# Patient Record
Sex: Female | Born: 1975 | Race: White | Hispanic: No | Marital: Single | Smoking: Former smoker
Health system: Southern US, Community
[De-identification: ages and names within clinical notes are randomized; demographics above are authoritative.]

## PROBLEM LIST (undated history)

## (undated) DIAGNOSIS — I10 Essential (primary) hypertension: Secondary | ICD-10-CM

---

## 2000-05-26 ENCOUNTER — Encounter: Admission: RE | Admit: 2000-05-26 | Discharge: 2000-05-26 | Payer: Self-pay | Admitting: Emergency Medicine

## 2005-07-16 ENCOUNTER — Emergency Department (HOSPITAL_COMMUNITY): Admission: EM | Admit: 2005-07-16 | Discharge: 2005-07-16 | Payer: Self-pay | Admitting: Emergency Medicine

## 2005-08-08 ENCOUNTER — Inpatient Hospital Stay (HOSPITAL_COMMUNITY): Admission: EM | Admit: 2005-08-08 | Discharge: 2005-08-09 | Payer: Self-pay | Admitting: Emergency Medicine

## 2005-08-09 ENCOUNTER — Ambulatory Visit: Payer: Self-pay | Admitting: Psychiatry

## 2005-08-09 ENCOUNTER — Inpatient Hospital Stay (HOSPITAL_COMMUNITY): Admission: RE | Admit: 2005-08-09 | Discharge: 2005-08-12 | Payer: Self-pay | Admitting: Psychiatry

## 2005-10-10 ENCOUNTER — Emergency Department (HOSPITAL_COMMUNITY): Admission: EM | Admit: 2005-10-10 | Discharge: 2005-10-11 | Payer: Self-pay | Admitting: Emergency Medicine

## 2005-10-14 ENCOUNTER — Emergency Department (HOSPITAL_COMMUNITY): Admission: EM | Admit: 2005-10-14 | Discharge: 2005-10-15 | Payer: Self-pay | Admitting: Emergency Medicine

## 2005-10-14 ENCOUNTER — Emergency Department (HOSPITAL_COMMUNITY): Admission: EM | Admit: 2005-10-14 | Discharge: 2005-10-14 | Payer: Self-pay | Admitting: *Deleted

## 2009-01-22 ENCOUNTER — Other Ambulatory Visit: Admission: RE | Admit: 2009-01-22 | Discharge: 2009-01-22 | Payer: Self-pay | Admitting: Obstetrics and Gynecology

## 2009-10-14 ENCOUNTER — Emergency Department (HOSPITAL_COMMUNITY): Admission: EM | Admit: 2009-10-14 | Discharge: 2009-10-14 | Payer: Self-pay | Admitting: Emergency Medicine

## 2009-12-04 ENCOUNTER — Emergency Department (HOSPITAL_COMMUNITY): Admission: EM | Admit: 2009-12-04 | Discharge: 2009-12-04 | Payer: Self-pay | Admitting: Emergency Medicine

## 2010-01-12 ENCOUNTER — Emergency Department (HOSPITAL_COMMUNITY): Admission: EM | Admit: 2010-01-12 | Discharge: 2010-01-12 | Payer: Self-pay | Admitting: Emergency Medicine

## 2010-06-03 ENCOUNTER — Emergency Department (HOSPITAL_BASED_OUTPATIENT_CLINIC_OR_DEPARTMENT_OTHER)
Admission: EM | Admit: 2010-06-03 | Discharge: 2010-06-03 | Disposition: A | Discharge status: Home / Self Care | Payer: Medicare Other | Attending: Emergency Medicine | Admitting: Emergency Medicine

## 2010-06-03 ENCOUNTER — Emergency Department (INDEPENDENT_AMBULATORY_CARE_PROVIDER_SITE_OTHER): Payer: Medicare Other

## 2010-06-03 ENCOUNTER — Encounter (HOSPITAL_BASED_OUTPATIENT_CLINIC_OR_DEPARTMENT_OTHER): Payer: Self-pay | Admitting: Radiology

## 2010-06-03 DIAGNOSIS — F17200 Nicotine dependence, unspecified, uncomplicated: Secondary | ICD-10-CM | POA: Insufficient documentation

## 2010-06-03 DIAGNOSIS — M545 Low back pain, unspecified: Secondary | ICD-10-CM

## 2010-06-03 DIAGNOSIS — N838 Other noninflammatory disorders of ovary, fallopian tube and broad ligament: Secondary | ICD-10-CM | POA: Insufficient documentation

## 2010-06-03 LAB — URINALYSIS, ROUTINE W REFLEX MICROSCOPIC
Specific Gravity, Urine: 1.013 (ref 1.005–1.030)
Urine Glucose, Fasting: NEGATIVE mg/dL
Urobilinogen, UA: 0.2 mg/dL (ref 0.0–1.0)

## 2010-06-03 LAB — PREGNANCY, URINE: Preg Test, Ur: NEGATIVE

## 2010-06-06 ENCOUNTER — Emergency Department (HOSPITAL_BASED_OUTPATIENT_CLINIC_OR_DEPARTMENT_OTHER)
Admission: EM | Admit: 2010-06-06 | Discharge: 2010-06-06 | Disposition: A | Discharge status: Home / Self Care | Payer: Medicare Other | Attending: Emergency Medicine | Admitting: Emergency Medicine

## 2010-06-06 DIAGNOSIS — M549 Dorsalgia, unspecified: Secondary | ICD-10-CM | POA: Insufficient documentation

## 2010-06-06 DIAGNOSIS — F17200 Nicotine dependence, unspecified, uncomplicated: Secondary | ICD-10-CM | POA: Insufficient documentation

## 2010-07-14 LAB — WET PREP, GENITAL
Clue Cells Wet Prep HPF POC: NONE SEEN
Trich, Wet Prep: NONE SEEN

## 2010-07-14 LAB — URINALYSIS, ROUTINE W REFLEX MICROSCOPIC
Hgb urine dipstick: NEGATIVE
Ketones, ur: NEGATIVE mg/dL
Specific Gravity, Urine: 1.03 (ref 1.005–1.030)
pH: 6 (ref 5.0–8.0)

## 2010-09-01 ENCOUNTER — Other Ambulatory Visit (HOSPITAL_COMMUNITY)
Admission: RE | Admit: 2010-09-01 | Discharge: 2010-09-01 | Disposition: A | Discharge status: Home / Self Care | Payer: Medicare Other | Source: Ambulatory Visit | Attending: Obstetrics and Gynecology | Admitting: Obstetrics and Gynecology

## 2010-09-01 DIAGNOSIS — Z124 Encounter for screening for malignant neoplasm of cervix: Secondary | ICD-10-CM | POA: Insufficient documentation

## 2010-09-01 DIAGNOSIS — Z113 Encounter for screening for infections with a predominantly sexual mode of transmission: Secondary | ICD-10-CM | POA: Insufficient documentation

## 2010-09-01 LAB — ANTIBODY SCREEN: Antibody Screen: NEGATIVE

## 2010-09-01 LAB — RUBELLA ANTIBODY, IGM: Rubella: IMMUNE

## 2010-09-01 LAB — HEPATITIS B SURFACE ANTIGEN: Hepatitis B Surface Ag: NEGATIVE
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
## 2010-10-25 IMAGING — US US PELVIS COMPLETE MODIFY
1 series · 13 of 25 positions shown · non-contrast
Comparison: CT 07/16/2005.

CLINICAL DATA: History of pain.  Last menstrual period 12/30/2009.



[Series 1: us pelvis complete modify · 0.23mm/px · 13 of 73 slices shown]
[im 1/73]
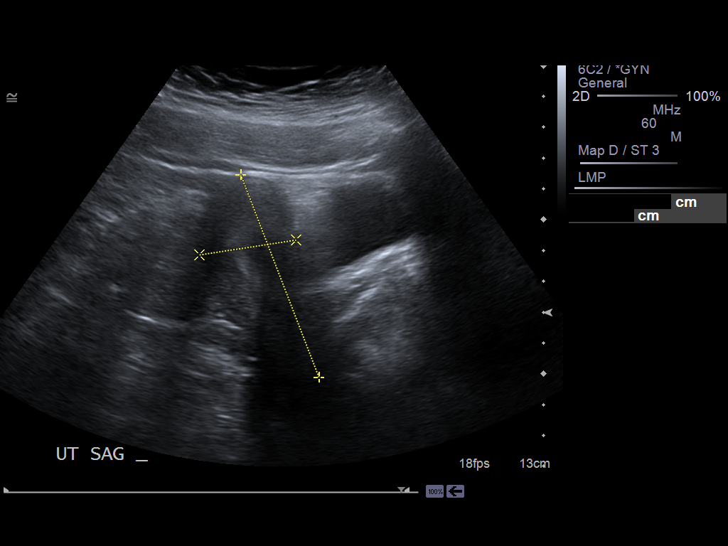
[im 7/73]
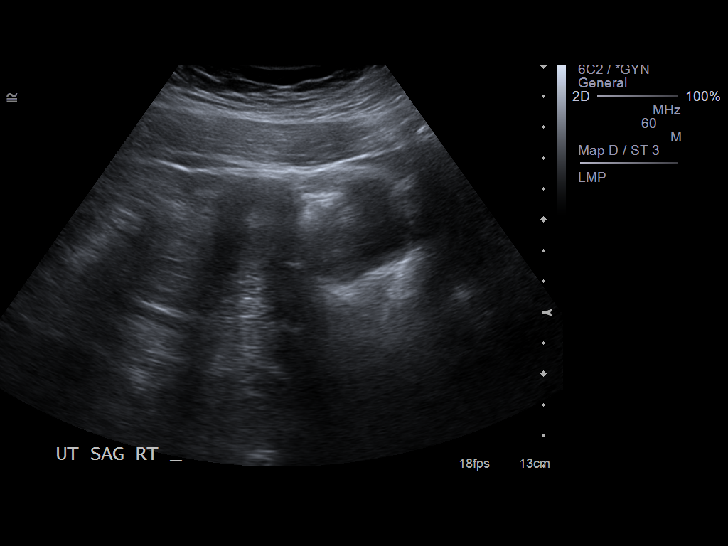
[im 13/73]
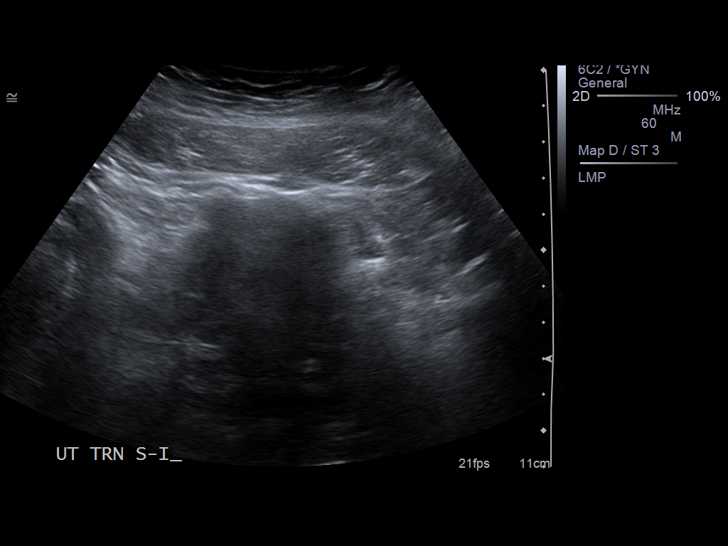
[im 19/73]
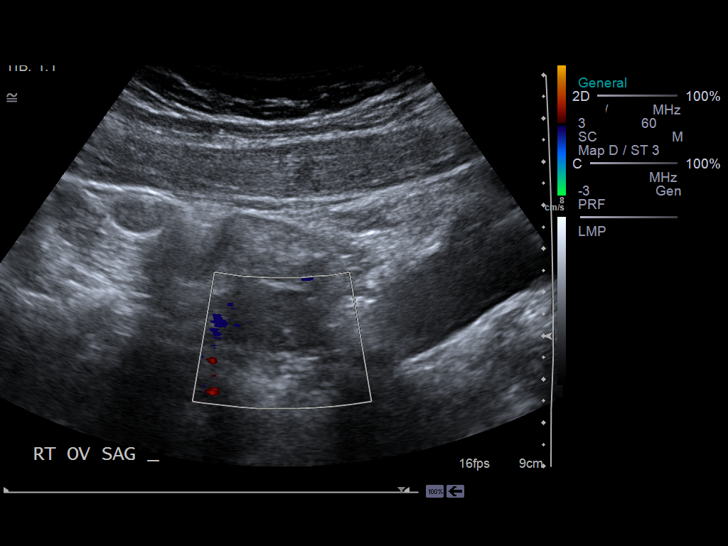
[im 25/73]
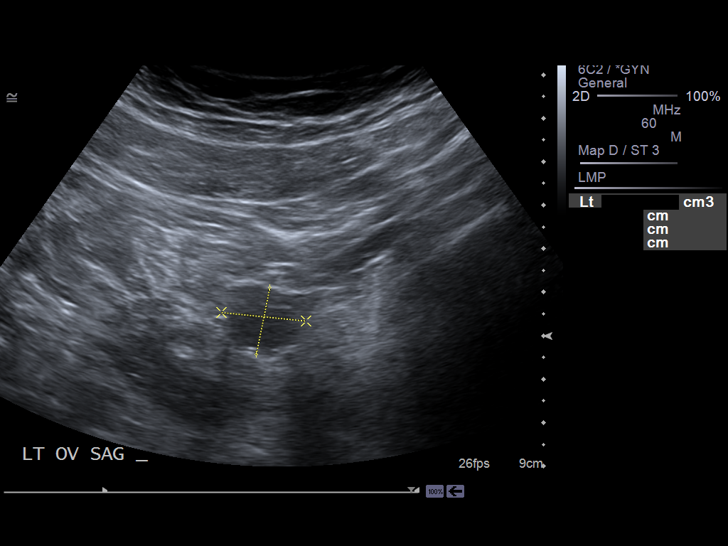
[im 31/73]
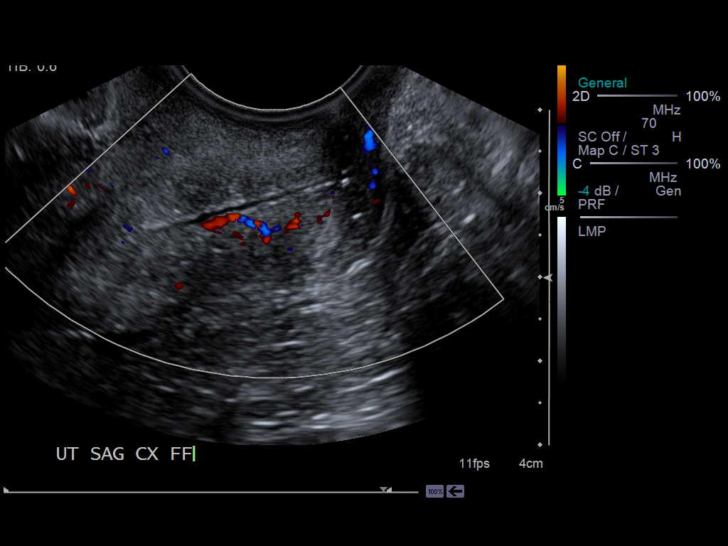
[im 37/73]
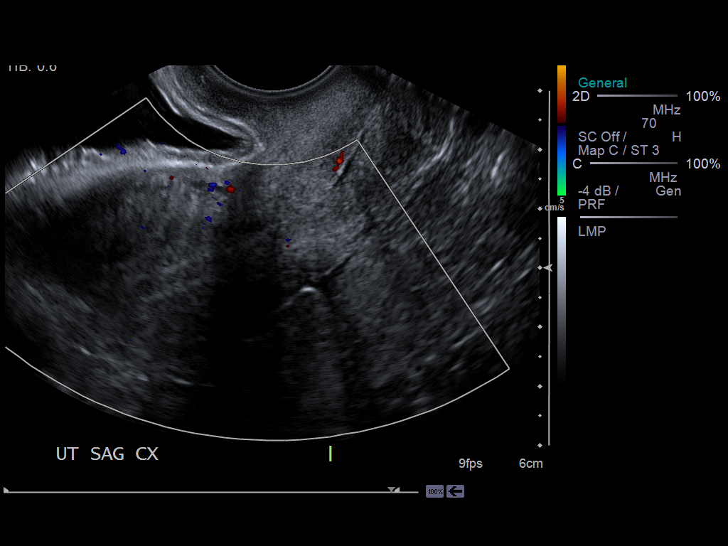
[im 43/73]
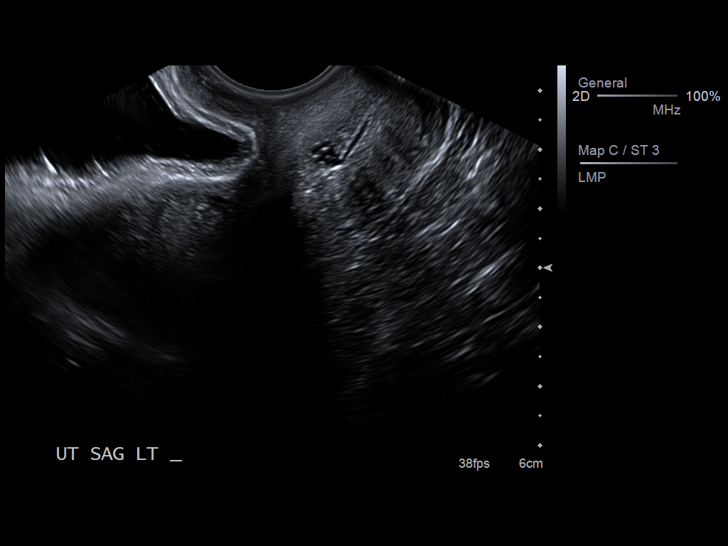
[im 49/73]
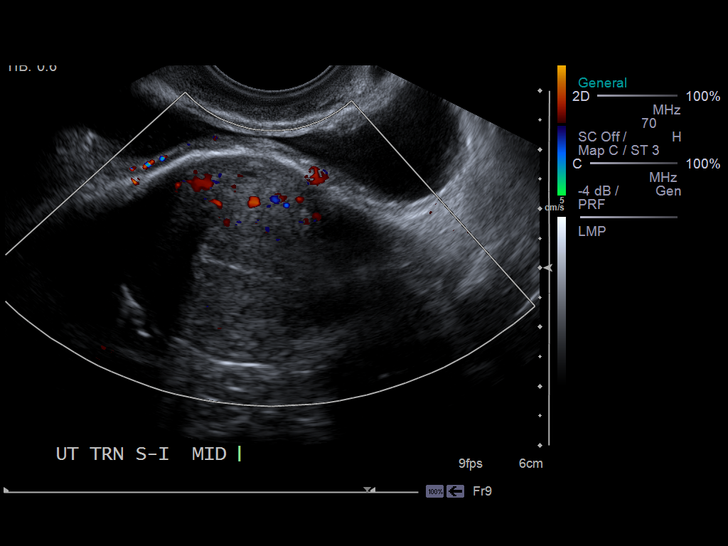
[im 55/73]
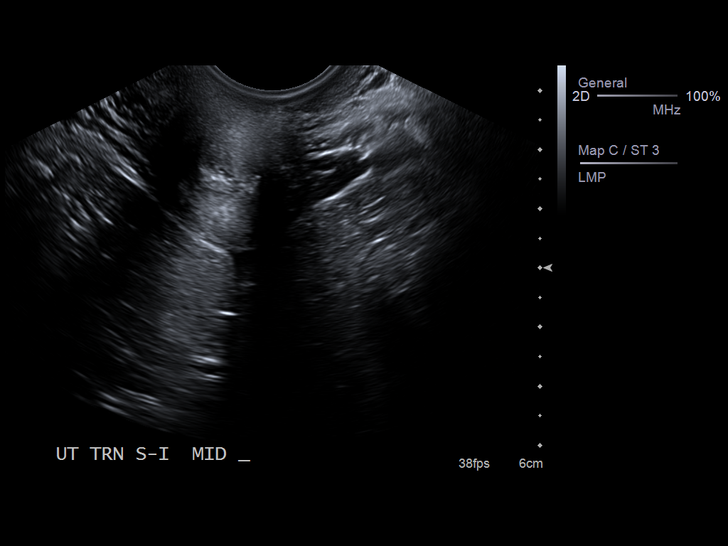
[im 61/73]
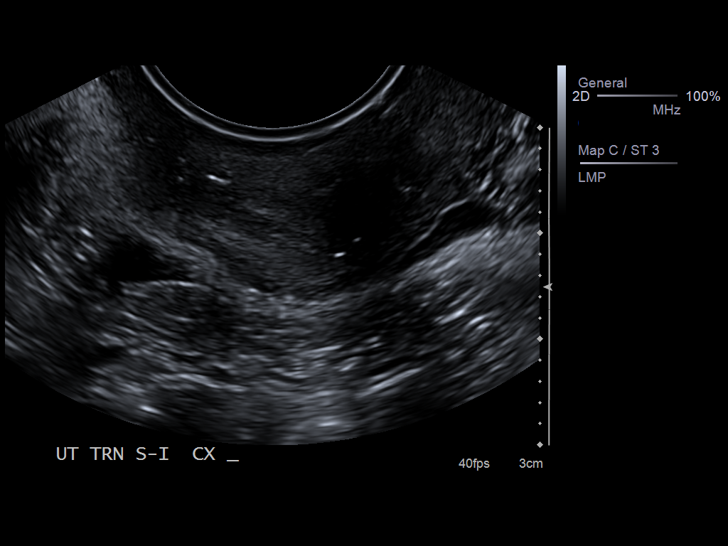
[im 67/73]
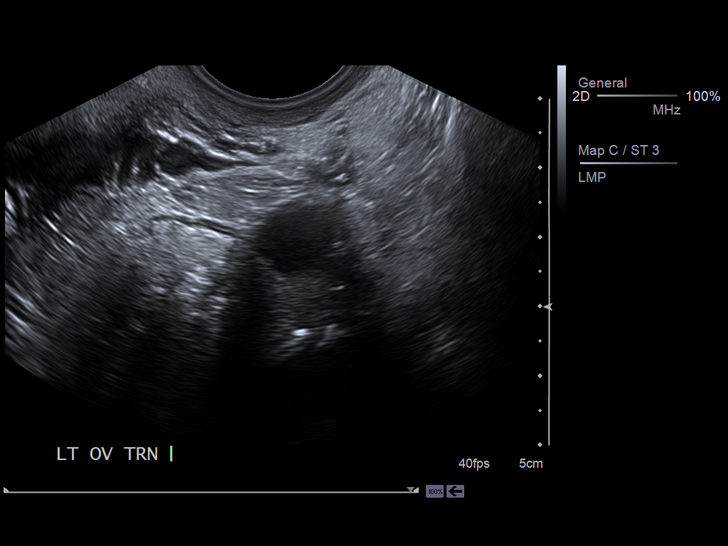
[im 73/73]
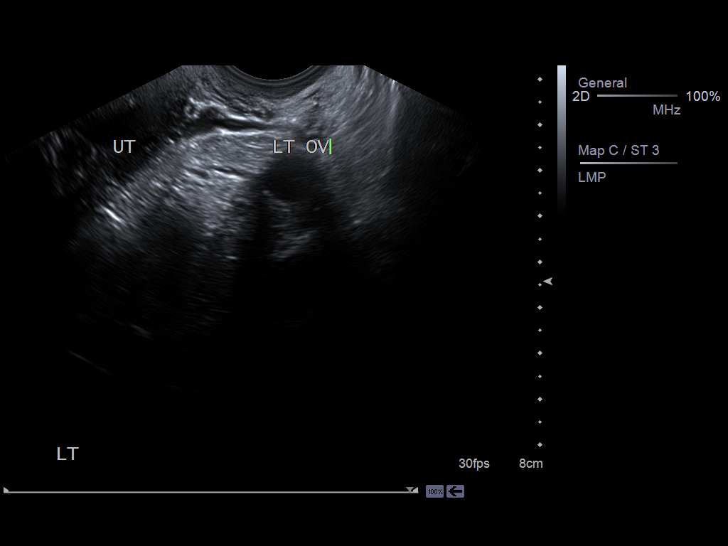

[13 of 25 positions shown; findings below may reference images not displayed]

FINDINGS: Uterus measures 7.9 x 3.3 x 4.1 cm.  No myometrial abnormality is
seen.

Endometrium measures 7.7 mm.  No endometrial mass is seen.  There
is a small amount of fluid in the lower endometrial mass in the
endocervical cervical canal.  No endometrial mass is evident.
There is a 5 mm complex cystic area of the cervix.

Right Ovary measures 2.1 x 2.6 x 2.2 cm.  No right ovarian lesion
was seen.  Color Doppler flow was seen.

Left Ovary measures 2.2 x 1.4 x 1.5 cm.  No left ovarian lesion was
seen.  There is color Doppler flow.

Other Findings:  Small amount of free fluid is in the pelvis.  No
tubal abnormality is seen.
IMPRESSION: No myometrial abnormality is seen.  No endometrial mass is seen.
There is a small amount of of fluid in the lower endometrial and
endocervical canal.  5 mm complex Nabothian cyst is seen.

No ovarian lesion was seen.

## 2010-10-27 ENCOUNTER — Emergency Department (INDEPENDENT_AMBULATORY_CARE_PROVIDER_SITE_OTHER): Payer: Medicare Other

## 2010-10-27 ENCOUNTER — Emergency Department (HOSPITAL_BASED_OUTPATIENT_CLINIC_OR_DEPARTMENT_OTHER)
Admission: EM | Admit: 2010-10-27 | Discharge: 2010-10-27 | Disposition: A | Discharge status: Home / Self Care | Payer: Medicare Other | Attending: Emergency Medicine | Admitting: Emergency Medicine

## 2010-10-27 ENCOUNTER — Encounter (HOSPITAL_BASED_OUTPATIENT_CLINIC_OR_DEPARTMENT_OTHER): Payer: Self-pay | Admitting: Radiology

## 2010-10-27 DIAGNOSIS — M25579 Pain in unspecified ankle and joints of unspecified foot: Secondary | ICD-10-CM

## 2010-10-27 DIAGNOSIS — R609 Edema, unspecified: Secondary | ICD-10-CM

## 2010-10-27 DIAGNOSIS — M7989 Other specified soft tissue disorders: Secondary | ICD-10-CM | POA: Insufficient documentation

## 2010-11-30 ENCOUNTER — Encounter (HOSPITAL_COMMUNITY): Payer: Self-pay | Admitting: *Deleted

## 2010-11-30 DIAGNOSIS — F17200 Nicotine dependence, unspecified, uncomplicated: Secondary | ICD-10-CM | POA: Insufficient documentation

## 2010-11-30 DIAGNOSIS — O210 Mild hyperemesis gravidarum: Secondary | ICD-10-CM | POA: Insufficient documentation

## 2010-11-30 NOTE — ED Notes (Signed)
Pt with N/V started the other day, pt thinks she is [redacted] weeks pregnant

## 2010-12-01 ENCOUNTER — Emergency Department (HOSPITAL_COMMUNITY)
Admission: EM | Admit: 2010-12-01 | Discharge: 2010-12-01 | Disposition: A | Discharge status: Home / Self Care | Payer: Medicare Other | Attending: Emergency Medicine | Admitting: Emergency Medicine

## 2010-12-01 DIAGNOSIS — R112 Nausea with vomiting, unspecified: Secondary | ICD-10-CM

## 2010-12-01 HISTORY — DX: Essential (primary) hypertension: I10

## 2010-12-01 LAB — URINALYSIS, ROUTINE W REFLEX MICROSCOPIC
Bilirubin Urine: NEGATIVE
Glucose, UA: NEGATIVE mg/dL
Hgb urine dipstick: NEGATIVE
Ketones, ur: NEGATIVE mg/dL
Nitrite: NEGATIVE
Specific Gravity, Urine: <1.005 — ABNORMAL LOW (ref 1.005–1.030)
Urobilinogen, UA: 0.2 mg/dL (ref 0.0–1.0)
pH: 6 (ref 5.0–8.0)

## 2010-12-01 LAB — CBC
HCT: 36.6 % (ref 36.0–46.0)
Hemoglobin: 13 g/dL (ref 12.0–15.0)
MCH: 32 pg (ref 26.0–34.0)
MCHC: 35.5 g/dL (ref 30.0–36.0)
MCV: 90.1 fL (ref 78.0–100.0)
Platelets: 205 K/uL (ref 150–400)
RBC: 4.06 MIL/uL (ref 3.87–5.11)
RDW: 13.1 % (ref 11.5–15.5)
WBC: 10.6 K/uL — ABNORMAL HIGH (ref 4.0–10.5)

## 2010-12-01 LAB — BASIC METABOLIC PANEL
BUN: 5 mg/dL — ABNORMAL LOW (ref 6–23)
Calcium: 9.1 mg/dL (ref 8.4–10.5)
Creatinine, Ser: <0.47 mg/dL — ABNORMAL LOW (ref 0.50–1.10)
Glucose, Bld: 81 mg/dL (ref 70–99)
Potassium: 3.2 mEq/L — ABNORMAL LOW (ref 3.5–5.1)

## 2010-12-01 LAB — PREGNANCY, URINE: Preg Test, Ur: POSITIVE

## 2010-12-01 MED ORDER — SODIUM CHLORIDE 0.9 % IV SOLN: Freq: Once | INTRAVENOUS | Status: DC

## 2010-12-01 NOTE — ED Provider Notes (Signed)
History     CSN: [id number] Arrival date & time: 12/01/2010 12:46 AM  Chief Complaint  Patient presents with  . Morning Sickness    pregnant-21 weeks   Patient is a 35 y.o. female presenting with vomiting. The history is provided by the patient.  Emesis  This is a recurrent (Patient is approximately [redacted] weeks pregnant and in has been having nausea and vomiting) problem. The current episode started 2 days ago. The problem has not changed since onset.The emesis has an appearance of stomach contents. There has been no fever. Pertinent negatives include no abdominal pain, no chills, no cough, no diarrhea and no headaches.    Past Medical History  Diagnosis Date  . Hypertension     History reviewed. No pertinent past surgical history.  History reviewed. No pertinent family history.  History  Substance Use Topics  . Smoking status: Current Everyday Smoker -- 1.0 packs/day    Types: Cigarettes  . Smokeless tobacco: Not on file  . Alcohol Use: No    OB History    Grav Para Term Preterm Abortions TAB SAB Ect Mult Living   1               Review of Systems  Constitutional: Negative for chills and fatigue.  HENT: Negative for congestion, sinus pressure and ear discharge.   Eyes: Negative for discharge.  Respiratory: Negative for cough.   Cardiovascular: Negative for chest pain.  Gastrointestinal: Positive for vomiting. Negative for abdominal pain and diarrhea.  Genitourinary: Negative for frequency and hematuria.  Musculoskeletal: Negative for back pain.  Skin: Negative for rash.  Neurological: Negative for seizures and headaches.  Hematological: Negative.   Psychiatric/Behavioral: Negative for hallucinations.    Physical Exam  BP 133/82  Pulse 104  Temp(Src) 98.2 F (36.8 C) (Oral)  Resp 20  Ht 5\' 4"  (1.626 m)  Wt 188 lb 11.2 oz (85.594 kg)  BMI 32.39 kg/m2  SpO2 100%  Physical Exam  Constitutional: She is oriented to person, place, and time. She appears  well-developed.  HENT:  Head: Normocephalic and atraumatic.  Eyes: Conjunctivae and EOM are normal. No scleral icterus.  Neck: Neck supple. No thyromegaly present.  Cardiovascular: Normal rate and regular rhythm.  Exam reveals no gallop and no friction rub.   No murmur heard. Pulmonary/Chest: No stridor. She has no wheezes. She has no rales. She exhibits no tenderness.  Abdominal: She exhibits no distension. There is tenderness. There is no rebound.  Musculoskeletal: Normal range of motion. She exhibits no edema.  Lymphadenopathy:    She has no cervical adenopathy.  Neurological: She is oriented to person, place, and time. Coordination normal.  Skin: No rash noted. No erythema.  Psychiatric: She has a normal mood and affect. Her behavior is normal.    ED Course  Procedures  MDM Pt left without getting a prescrition for nauseau      Benny Lennert, MD 12/01/10 [phone]

## 2010-12-01 NOTE — ED Notes (Signed)
Pt states she is 21 weeks preg

## 2011-03-18 ENCOUNTER — Inpatient Hospital Stay (HOSPITAL_COMMUNITY)
Admission: AD | Admit: 2011-03-18 | Discharge: 2011-03-22 | DRG: 766 | Disposition: A | Discharge status: Home / Self Care | Payer: Medicare Other | Source: Ambulatory Visit | Attending: Obstetrics & Gynecology | Admitting: Obstetrics & Gynecology

## 2011-03-18 ENCOUNTER — Encounter (HOSPITAL_COMMUNITY): Payer: Self-pay | Admitting: Obstetrics and Gynecology

## 2011-03-18 DIAGNOSIS — O324XX0 Maternal care for high head at term, not applicable or unspecified: Secondary | ICD-10-CM | POA: Diagnosis present

## 2011-03-18 DIAGNOSIS — O0930 Supervision of pregnancy with insufficient antenatal care, unspecified trimester: Secondary | ICD-10-CM

## 2011-03-18 LAB — CBC
Hemoglobin: 13.7 g/dL (ref 12.0–15.0)
MCH: 32.1 pg (ref 26.0–34.0)
RBC: 4.27 MIL/uL (ref 3.87–5.11)

## 2011-03-18 MED ORDER — LACTATED RINGERS IV SOLN
500.0000 mL | INTRAVENOUS | Status: DC | PRN
  Administered 2011-03-19: 500 mL via INTRAVENOUS

## 2011-03-18 MED ORDER — ONDANSETRON HCL 4 MG/2ML IJ SOLN: 4.0000 mg | Freq: Four times a day (QID) | INTRAMUSCULAR | Status: DC | PRN

## 2011-03-18 MED ORDER — ACETAMINOPHEN 325 MG PO TABS: 650.0000 mg | ORAL_TABLET | ORAL | Status: DC | PRN

## 2011-03-18 MED ORDER — OXYTOCIN BOLUS FROM INFUSION
500.0000 mL | Freq: Once | INTRAVENOUS | Status: DC
  Filled 2011-03-18: qty 500
  Filled 2011-03-18: qty 1000

## 2011-03-18 MED ORDER — LACTATED RINGERS IV SOLN
INTRAVENOUS | Status: DC
  Administered 2011-03-18 – 2011-03-19 (×4): via INTRAVENOUS

## 2011-03-18 MED ORDER — NALBUPHINE SYRINGE 5 MG/0.5 ML
10.0000 mg | INJECTION | INTRAMUSCULAR | Status: DC | PRN
  Administered 2011-03-19: 10 mg via INTRAVENOUS
  Filled 2011-03-18: qty 1

## 2011-03-18 MED ORDER — OXYTOCIN 20 UNITS IN LACTATED RINGERS INFUSION - SIMPLE: 125.0000 mL/h | Freq: Once | INTRAVENOUS | Status: DC

## 2011-03-18 MED ORDER — CITRIC ACID-SODIUM CITRATE 334-500 MG/5ML PO SOLN
30.0000 mL | ORAL | Status: DC | PRN
  Administered 2011-03-19: 30 mL via ORAL
  Filled 2011-03-18: qty 15

## 2011-03-18 MED ORDER — LIDOCAINE HCL (PF) 1 % IJ SOLN
30.0000 mL | INTRAMUSCULAR | Status: DC | PRN
  Filled 2011-03-18: qty 30

## 2011-03-18 MED ORDER — IBUPROFEN 600 MG PO TABS: 600.0000 mg | ORAL_TABLET | Freq: Four times a day (QID) | ORAL | Status: DC | PRN

## 2011-03-18 MED ORDER — FLEET ENEMA 7-19 GM/118ML RE ENEM: 1.0000 | ENEMA | RECTAL | Status: DC | PRN

## 2011-03-18 MED ORDER — OXYCODONE-ACETAMINOPHEN 5-325 MG PO TABS: 2.0000 | ORAL_TABLET | ORAL | Status: DC | PRN

## 2011-03-18 NOTE — H&P (Signed)
[name] [name] is a 35 y.o. female presenting for labor @ 40 [redacted] weeks gestation. Limited PNC @ FT, no care since 30 weeks. Maternal Medical History:  Contractions: Onset was 6-12 hours ago.   Frequency: regular.   Perceived severity is mild.    Fetal activity: Perceived fetal activity is normal.   Last perceived fetal movement was within the past hour.      OB History    Grav Para Term Preterm Abortions TAB SAB Ect Mult Living   1              Past Medical History  Diagnosis Date  . Hypertension    History reviewed. No pertinent past surgical history. Family History: family history includes Diabetes in her mother. Social History:  reports that she quit smoking about 4 weeks ago. Her smoking use included Cigarettes. She has a 17 pack-year smoking history. She does not have any smokeless tobacco history on file. She reports that she does not drink alcohol or use illicit drugs.  ROS  Dilation: 4 Effacement (%): 90 Station: -2 Exam by:: Raelyn Mora, RN Blood pressure 127/81, pulse 76, temperature 98.2 F (36.8 C), temperature source Oral, resp. rate 20. Exam Physical Exam  Prenatal labs: ABO, Rh:O pos   Antibody:  neg Rubella:  imm RPR:   nr HBsAg:   nr HIV:   nr GBS:   unknown    Assessment/Plan: Limited PNC, preg @ 40 1 stable maternal-fetal unit in early labor. Zerita Boers 03/18/2011, 9:30 PM

## 2011-03-18 NOTE — H&P (Signed)
Attestation of Attending Supervision of Advanced Practitioner: Evaluation and management procedures were performed by the PA/NP/CNM/OB Fellow under my supervision/collaboration. Chart reviewed, and agree with management and plan.  UGONNA ANYANWU, M.D. 03/18/2011 10:39 PM   

## 2011-03-18 NOTE — Progress Notes (Signed)
"  I went shopping with my momma yesterday and then have been losing my mucous plug since 0400.  I have been having pains since about 0130.  I wasn't able to sleep all night.  I haven't been to [facility] since I was 30 weeks, because I didn't have a ride down there."

## 2011-03-18 NOTE — Progress Notes (Signed)
[name] [name] is a 35 y.o. G1P0 at [redacted]w[redacted]d by ultrasound admitted for early labor @ 40 1 wks.  Subjective:   Objective: BP 118/78  Pulse 92  Temp(Src) 98.2 F (36.8 C) (Oral)  Resp 20  Ht 5\' 4"  (1.626 m)  Wt 83.915 kg (185 lb)  BMI 31.76 kg/m2      FHT:  FHR: 135-140 bpm, variability: moderate,  accelerations:  Present,  decelerations:  Absent UC:   regular, every 3-5 minutes mild intensity. SVE:   Dilation: 3.5 Effacement (%): 100 Station: -2 Exam by:: Corky Sing, RN  Labs: Lab Results  Component Value Date   WBC 10.5 03/18/2011   HGB 13.7 03/18/2011   HCT 38.6 03/18/2011   MCV 90.4 03/18/2011   PLT 224 03/18/2011    Assessment / Plan: early labor progressing as expected.  Labor: Progressing normally Preeclampsia:  no signs or symptoms of toxicity Fetal Wellbeing:  Category I Pain Control:  Labor support without medications I/D:  n/a Anticipated MOD:  NSVD  Zerita Boers 03/18/2011, 11:34 PM

## 2011-03-19 ENCOUNTER — Inpatient Hospital Stay (HOSPITAL_COMMUNITY): Payer: Medicare Other | Admitting: Anesthesiology

## 2011-03-19 ENCOUNTER — Encounter (HOSPITAL_COMMUNITY): Payer: Self-pay | Admitting: *Deleted

## 2011-03-19 ENCOUNTER — Other Ambulatory Visit: Payer: Self-pay | Admitting: Obstetrics & Gynecology

## 2011-03-19 ENCOUNTER — Encounter (HOSPITAL_COMMUNITY): Payer: Self-pay | Admitting: Anesthesiology

## 2011-03-19 ENCOUNTER — Encounter (HOSPITAL_COMMUNITY)
Admission: AD | Disposition: A | Discharge status: Home / Self Care | Payer: Self-pay | Source: Ambulatory Visit | Attending: Obstetrics & Gynecology

## 2011-03-19 DIAGNOSIS — O0930 Supervision of pregnancy with insufficient antenatal care, unspecified trimester: Secondary | ICD-10-CM

## 2011-03-19 DIAGNOSIS — O324XX0 Maternal care for high head at term, not applicable or unspecified: Secondary | ICD-10-CM

## 2011-03-19 LAB — RPR: RPR Ser Ql: NONREACTIVE

## 2011-03-19 LAB — RAPID URINE DRUG SCREEN, HOSP PERFORMED
Opiates: NOT DETECTED
Tetrahydrocannabinol: NOT DETECTED

## 2011-03-19 LAB — COMPREHENSIVE METABOLIC PANEL
ALT: 17 U/L (ref 0–35)
AST: 18 U/L (ref 0–37)
Albumin: 2.8 g/dL — ABNORMAL LOW (ref 3.5–5.2)
Alkaline Phosphatase: 176 U/L — ABNORMAL HIGH (ref 39–117)
Glucose, Bld: 118 mg/dL — ABNORMAL HIGH (ref 70–99)
Potassium: 4.4 mEq/L (ref 3.5–5.1)
Sodium: 133 mEq/L — ABNORMAL LOW (ref 135–145)
Total Protein: 5.9 g/dL — ABNORMAL LOW (ref 6.0–8.3)

## 2011-03-19 SURGERY — Surgical Case
Anesthesia: Epidural | Site: Abdomen | Wound class: Clean Contaminated

## 2011-03-19 MED ORDER — OXYTOCIN 10 UNIT/ML IJ SOLN
INTRAMUSCULAR | Status: AC
  Filled 2011-03-19: qty 2

## 2011-03-19 MED ORDER — EPHEDRINE 5 MG/ML INJ: 10.0000 mg | INTRAVENOUS | Status: DC | PRN

## 2011-03-19 MED ORDER — SCOPOLAMINE 1 MG/3DAYS TD PT72
1.0000 | MEDICATED_PATCH | Freq: Once | TRANSDERMAL | Status: DC
  Administered 2011-03-19: 1.5 mg via TRANSDERMAL

## 2011-03-19 MED ORDER — MORPHINE SULFATE 0.5 MG/ML IJ SOLN
INTRAMUSCULAR | Status: AC
  Filled 2011-03-19: qty 10

## 2011-03-19 MED ORDER — OXYTOCIN 10 UNIT/ML IJ SOLN
20.0000 [IU] | INTRAVENOUS | Status: DC | PRN
  Administered 2011-03-19: 20 [IU] via INTRAVENOUS

## 2011-03-19 MED ORDER — LACTATED RINGERS IV SOLN
500.0000 mL | Freq: Once | INTRAVENOUS | Status: AC
  Administered 2011-03-19: 500 mL via INTRAVENOUS

## 2011-03-19 MED ORDER — HYDROMORPHONE HCL PF 1 MG/ML IJ SOLN
INTRAMUSCULAR | Status: AC
  Filled 2011-03-19: qty 1

## 2011-03-19 MED ORDER — CEFAZOLIN SODIUM 1-5 GM-% IV SOLN
INTRAVENOUS | Status: DC | PRN
  Administered 2011-03-19: 2 g via INTRAVENOUS

## 2011-03-19 MED ORDER — DIPHENHYDRAMINE HCL 50 MG/ML IJ SOLN
12.5000 mg | INTRAMUSCULAR | Status: DC | PRN
  Administered 2011-03-19: 12.5 mg via INTRAVENOUS
  Filled 2011-03-19 (×2): qty 1

## 2011-03-19 MED ORDER — SODIUM BICARBONATE 8.4 % IV SOLN
INTRAVENOUS | Status: AC
  Filled 2011-03-19: qty 50

## 2011-03-19 MED ORDER — FENTANYL 2.5 MCG/ML BUPIVACAINE 1/10 % EPIDURAL INFUSION (WH - ANES)
INTRAMUSCULAR | Status: DC | PRN
  Administered 2011-03-19: 14 mL/h via EPIDURAL

## 2011-03-19 MED ORDER — ONDANSETRON HCL 4 MG/2ML IJ SOLN
INTRAMUSCULAR | Status: DC | PRN
  Administered 2011-03-19: 4 mg via INTRAVENOUS

## 2011-03-19 MED ORDER — MORPHINE SULFATE (PF) 0.5 MG/ML IJ SOLN
INTRAMUSCULAR | Status: DC | PRN
  Administered 2011-03-19: 4 mg via EPIDURAL

## 2011-03-19 MED ORDER — MEPERIDINE HCL 25 MG/ML IJ SOLN
INTRAMUSCULAR | Status: AC
  Filled 2011-03-19: qty 1

## 2011-03-19 MED ORDER — FENTANYL CITRATE 0.05 MG/ML IJ SOLN: 25.0000 ug | INTRAMUSCULAR | Status: DC | PRN

## 2011-03-19 MED ORDER — FENTANYL 2.5 MCG/ML BUPIVACAINE 1/10 % EPIDURAL INFUSION (WH - ANES)
14.0000 mL/h | INTRAMUSCULAR | Status: DC
  Administered 2011-03-19 (×5): 14 mL/h via EPIDURAL
  Filled 2011-03-19 (×5): qty 60

## 2011-03-19 MED ORDER — KETOROLAC TROMETHAMINE 30 MG/ML IJ SOLN
INTRAMUSCULAR | Status: AC
  Administered 2011-03-19: 30 mg via INTRAVENOUS
  Filled 2011-03-19: qty 1

## 2011-03-19 MED ORDER — LACTATED RINGERS IV SOLN
INTRAVENOUS | Status: DC | PRN
  Administered 2011-03-19 (×3): via INTRAVENOUS

## 2011-03-19 MED ORDER — OXYTOCIN 20 UNITS IN LACTATED RINGERS INFUSION - SIMPLE
1.0000 m[IU]/min | INTRAVENOUS | Status: DC
  Administered 2011-03-19: 2 m[IU]/min via INTRAVENOUS

## 2011-03-19 MED ORDER — MEPERIDINE HCL 25 MG/ML IJ SOLN: 6.2500 mg | INTRAMUSCULAR | Status: DC | PRN

## 2011-03-19 MED ORDER — SCOPOLAMINE 1 MG/3DAYS TD PT72
MEDICATED_PATCH | TRANSDERMAL | Status: AC
  Administered 2011-03-19: 1.5 mg via TRANSDERMAL
  Filled 2011-03-19: qty 1

## 2011-03-19 MED ORDER — PHENYLEPHRINE 40 MCG/ML (10ML) SYRINGE FOR IV PUSH (FOR BLOOD PRESSURE SUPPORT)
80.0000 ug | PREFILLED_SYRINGE | INTRAVENOUS | Status: DC | PRN
  Filled 2011-03-19: qty 10

## 2011-03-19 MED ORDER — METHYLERGONOVINE MALEATE 0.2 MG/ML IJ SOLN
INTRAMUSCULAR | Status: DC | PRN
  Administered 2011-03-19: 0.2 mg via INTRAMUSCULAR

## 2011-03-19 MED ORDER — SODIUM BICARBONATE 8.4 % IV SOLN
INTRAVENOUS | Status: DC | PRN
  Administered 2011-03-19: 10 mL via EPIDURAL

## 2011-03-19 MED ORDER — SODIUM CHLORIDE 0.9 % IR SOLN
Status: DC | PRN
  Administered 2011-03-19: 1000 mL

## 2011-03-19 MED ORDER — HYDROMORPHONE BOLUS VIA INFUSION
INTRAVENOUS | Status: DC | PRN
  Administered 2011-03-19: 1 mg via INTRAVENOUS

## 2011-03-19 MED ORDER — METOCLOPRAMIDE HCL 5 MG/ML IJ SOLN: 10.0000 mg | Freq: Once | INTRAMUSCULAR | Status: DC | PRN

## 2011-03-19 MED ORDER — LIDOCAINE HCL 1.5 % IJ SOLN
INTRAMUSCULAR | Status: DC | PRN
  Administered 2011-03-19 (×2): 4 mL via EPIDURAL

## 2011-03-19 MED ORDER — TERBUTALINE SULFATE 1 MG/ML IJ SOLN: 0.2500 mg | Freq: Once | INTRAMUSCULAR | Status: DC | PRN

## 2011-03-19 MED ORDER — MEPERIDINE HCL 25 MG/ML IJ SOLN
INTRAMUSCULAR | Status: DC | PRN
  Administered 2011-03-19: 25 mg via INTRAVENOUS

## 2011-03-19 MED ORDER — KETOROLAC TROMETHAMINE 30 MG/ML IJ SOLN: 30.0000 mg | Freq: Four times a day (QID) | INTRAMUSCULAR | Status: AC | PRN

## 2011-03-19 MED ORDER — FENTANYL CITRATE 0.05 MG/ML IJ SOLN
INTRAMUSCULAR | Status: AC
  Filled 2011-03-19: qty 2

## 2011-03-19 MED ORDER — MORPHINE SULFATE (PF) 0.5 MG/ML IJ SOLN
INTRAMUSCULAR | Status: DC | PRN
  Administered 2011-03-19: 1 mg via INTRAVENOUS

## 2011-03-19 MED ORDER — ONDANSETRON HCL 4 MG/2ML IJ SOLN
INTRAMUSCULAR | Status: AC
  Filled 2011-03-19: qty 2

## 2011-03-19 MED ORDER — PHENYLEPHRINE 40 MCG/ML (10ML) SYRINGE FOR IV PUSH (FOR BLOOD PRESSURE SUPPORT)
PREFILLED_SYRINGE | INTRAVENOUS | Status: AC
  Filled 2011-03-19: qty 10

## 2011-03-19 MED ORDER — PHENYLEPHRINE 40 MCG/ML (10ML) SYRINGE FOR IV PUSH (FOR BLOOD PRESSURE SUPPORT): 80.0000 ug | PREFILLED_SYRINGE | INTRAVENOUS | Status: DC | PRN

## 2011-03-19 MED ORDER — OXYTOCIN 10 UNIT/ML IJ SOLN
INTRAMUSCULAR | Status: AC
  Filled 2011-03-19: qty 4

## 2011-03-19 MED ORDER — KETOROLAC TROMETHAMINE 30 MG/ML IJ SOLN
30.0000 mg | Freq: Four times a day (QID) | INTRAMUSCULAR | Status: AC | PRN
  Administered 2011-03-19 – 2011-03-20 (×2): 30 mg via INTRAVENOUS
  Filled 2011-03-19 (×2): qty 1

## 2011-03-19 MED ORDER — MIDAZOLAM HCL 2 MG/2ML IJ SOLN: 0.5000 mg | Freq: Once | INTRAMUSCULAR | Status: DC | PRN

## 2011-03-19 MED ORDER — METHYLERGONOVINE MALEATE 0.2 MG/ML IJ SOLN
INTRAMUSCULAR | Status: AC
  Filled 2011-03-19: qty 1

## 2011-03-19 MED ORDER — EPHEDRINE 5 MG/ML INJ
10.0000 mg | INTRAVENOUS | Status: DC | PRN
  Filled 2011-03-19: qty 8

## 2011-03-19 MED ORDER — FENTANYL CITRATE 0.05 MG/ML IJ SOLN
INTRAMUSCULAR | Status: DC | PRN
  Administered 2011-03-19: 100 ug via INTRAVENOUS

## 2011-03-19 MED ORDER — LIDOCAINE-EPINEPHRINE (PF) 2 %-1:200000 IJ SOLN
INTRAMUSCULAR | Status: AC
  Filled 2011-03-19: qty 20

## 2011-03-19 SURGICAL SUPPLY — 33 items
CHLORAPREP W/TINT 26ML (MISCELLANEOUS) ×2 IMPLANT
CLOTH BEACON ORANGE TIMEOUT ST (SAFETY) ×2 IMPLANT
CONTAINER PREFILL 10% NBF 15ML (MISCELLANEOUS) IMPLANT
DRAIN JACKSON PRT FLT 7MM (DRAIN) IMPLANT
DRESSING TELFA 8X3 (GAUZE/BANDAGES/DRESSINGS) ×1 IMPLANT
DRSG PAD ABDOMINAL 8X10 ST (GAUZE/BANDAGES/DRESSINGS) ×1 IMPLANT
ELECT REM PT RETURN 9FT ADLT (ELECTROSURGICAL) ×2
ELECTRODE REM PT RTRN 9FT ADLT (ELECTROSURGICAL) ×1 IMPLANT
EVACUATOR SILICONE 100CC (DRAIN) IMPLANT
EXTRACTOR VACUUM M CUP 4 TUBE (SUCTIONS) ×1 IMPLANT
GAUZE SPONGE 4X4 12PLY STRL LF (GAUZE/BANDAGES/DRESSINGS) ×4 IMPLANT
GLOVE BIO SURGEON STRL SZ7 (GLOVE) ×2 IMPLANT
GLOVE BIOGEL PI IND STRL 7.0 (GLOVE) ×2 IMPLANT
GLOVE BIOGEL PI INDICATOR 7.0 (GLOVE) ×2
GOWN PREVENTION PLUS LG XLONG (DISPOSABLE) ×6 IMPLANT
HEMOSTAT SURGICEL 2X14 (HEMOSTASIS) ×1 IMPLANT
KIT ABG SYR 3ML LUER SLIP (SYRINGE) ×1 IMPLANT
NDL HYPO 25X5/8 SAFETYGLIDE (NEEDLE) ×1 IMPLANT
NEEDLE HYPO 25X5/8 SAFETYGLIDE (NEEDLE) ×2 IMPLANT
NS IRRIG 1000ML POUR BTL (IV SOLUTION) ×2 IMPLANT
PACK C SECTION WH (CUSTOM PROCEDURE TRAY) ×2 IMPLANT
PAD ABD 7.5X8 STRL (GAUZE/BANDAGES/DRESSINGS) ×1 IMPLANT
RTRCTR C-SECT PINK 25CM LRG (MISCELLANEOUS) ×2 IMPLANT
SLEEVE SCD COMPRESS KNEE MED (MISCELLANEOUS) ×1 IMPLANT
SPONGE GAUZE 4X4 12PLY (GAUZE/BANDAGES/DRESSINGS) ×1 IMPLANT
STAPLER VISISTAT 35W (STAPLE) IMPLANT
SUT VIC AB 0 CTX 36 (SUTURE) ×12
SUT VIC AB 0 CTX36XBRD ANBCTRL (SUTURE) ×5 IMPLANT
SUT VIC AB 4-0 KS 27 (SUTURE) IMPLANT
TAPE CLOTH SURG 4X10 WHT LF (GAUZE/BANDAGES/DRESSINGS) ×1 IMPLANT
TOWEL OR 17X24 6PK STRL BLUE (TOWEL DISPOSABLE) ×4 IMPLANT
TRAY FOLEY CATH 14FR (SET/KITS/TRAYS/PACK) ×1 IMPLANT
WATER STERILE IRR 1000ML POUR (IV SOLUTION) ×2 IMPLANT

## 2011-03-19 NOTE — Progress Notes (Signed)
[name] [name] is a 35 y.o. G1P0 at [redacted]w[redacted]d by ultrasound admitted for early labor  Subjective:   Objective: BP 125/69  Pulse 77  Temp(Src) 98.5 F (36.9 C) (Oral)  Resp 18  Ht 5\' 4"  (1.626 m)  Wt 83.915 kg (185 lb)  BMI 31.76 kg/m2      FHT:  FHR: 120s bpm, variability: moderate,  accelerations:  Present,  decelerations:  Absent UC:   regular, every 4-5 minutes SVE:   Dilation: 4.5 Effacement (%): 100 Station: -2 Exam by:: Corky Sing, RN  Labs: Lab Results  Component Value Date   WBC 10.5 03/18/2011   HGB 13.7 03/18/2011   HCT 38.6 03/18/2011   MCV 90.4 03/18/2011   PLT 224 03/18/2011    Assessment / Plan: Spontaneous labor, progressing normally  Labor: Progressing normally Preeclampsia:  no signs or symptoms of toxicity Fetal Wellbeing:  Category I Pain Control:   I/D:  n/a Anticipated MOD:  NSVD  Zerita Boers 03/19/2011, 2:04 AM

## 2011-03-19 NOTE — Op Note (Signed)
Lorrene Reid PROCEDURE DATE: 03/19/2011  PREOPERATIVE DIAGNOSIS: Intrauterine pregnancy at  [redacted]w[redacted]d weeks gestation; failure to progress: arrest of descent  POSTOPERATIVE DIAGNOSIS: The same  PROCEDURE:     Cesarean Section  SURGEON:  Dr. Elsie Lincoln  ASSISTANT: Dr Candelaria Celeste  INDICATIONS: KENT BRAUNSCHWEIG is a 35 y.o. G1P1001 at [redacted]w[redacted]d brought to the operating room for cesarean section secondary to failure to progress: arrest of descent.  The risks of cesarean section discussed with the patient included but were not limited to: bleeding which may require transfusion or reoperation; infection which may require antibiotics; injury to bowel, bladder, ureters or other surrounding organs; injury to the fetus; need for additional procedures including hysterectomy in the event of a life-threatening hemorrhage; placental abnormalities wth subsequent pregnancies, incisional problems, thromboembolic phenomenon and other postoperative/anesthesia complications. The patient concurred with the proposed plan, giving informed written consent for the procedure.    FINDINGS:  Viable female infant in cephalic presentation.  Apgars 7 and 9, weight, 6 pounds and 9 ounces.  Clear amniotic fluid.  Intact placenta, three vessel cord.  Normal uterus, fallopian tubes and ovaries bilaterally.  ANESTHESIA:    Spinal INTRAVENOUS FLUIDS:1500 ml ESTIMATED BLOOD LOSS: 700 ml URINE OUTPUT:  100 ml SPECIMENS: Placenta sent to pathology COMPLICATIONS: None immediate  PROCEDURE IN DETAIL:  The patient received intravenous antibiotics and had sequential compression devices applied to her lower extremities while in the preoperative area.  She was then taken to the operating room where epidural anesthesia was dosed up to surgical level and was found to be adequate. She was then placed in a dorsal supine position with a leftward tilt, and prepped and draped in a sterile manner.  A foley catheter had already been placed into her  bladder and attached to constant gravity.  The bladder drained pink urine prior to starting the operation, which was clearing at the end of the procedure.  After an adequate timeout was performed, a Pfannenstiel skin incision was made with scalpel and carried through to the underlying layer of fascia. The fascia was incised in the midline and this incision was extended bilaterally using the Mayo scissors. Kocher clamps were applied to the superior aspect of the fascial incision and the underlying rectus muscles were dissected off bluntly. A similar process was carried out on the inferior aspect of the facial incision. The rectus muscles were separated in the midline bluntly and the peritoneum was then entered bluntly.  Due to peritoneal restriction, a small modified Malard incision was made.  Attention was turned to the lower uterine segment where a transverse hysterotomy was made with a scalpel and extended bilaterally bluntly, then extended with bandage scissors.  The bladder blade was then removed.  A nurse assisted by elevating the infant's head.  The infant's head was brought to the hysterotomy and a MityVac was placed 3 cm posterior to the anterior fontenelle.  Pressure was increased to and the head was successfully delivered with one pull.  The vacuum was disengaged and the infant was successfully delivered.  The cord was clamped and cut and infant was handed over to awaiting neonatology team. Arterial blood gas was obtained.  Uterine massage was then administered and the placenta delivered intact with three-vessel cord. The uterus was then cleared of clot and debris.  The hysterotomy was closed with 0 Vicryl in a running locked fashion, and an imbricating layer was also placed with a 0 Vicryl. Overall, excellent hemostasis was noted. The abdomen and the  pelvis were copiously irrigated and cleared of all clot and debris. Hemostasis was confirmed on all surfaces.  The fascia was then closed using 0  Vicryl in a running fashion.  The skin was closed with staples. The patient tolerated the procedure well. Sponge, lap, instrument and needle counts were correct x 2. She was taken to the recovery room in stable condition.    Candelaria Celeste JEHIEL DO 03/19/2011 10:11 PM

## 2011-03-19 NOTE — Progress Notes (Addendum)
.  [name] [name] is a 35 y.o. G1P0 at [redacted]w[redacted]d, admitted for SOOL  Subjective: Feels pelvic pressure with UCs.  Had inadequate MVUs after Dr. Penne Lash place IUPC earlier due to protracted late active phase. Then after pitocin increase progressed to 9/100/0. BPs in pregnnacy 130s/80  Objective: BP 122/78  Pulse 87  Temp(Src) 98.1 F (36.7 C) (Oral)  Resp 22  Ht 5\' 4"  (1.626 m)  Wt 83.915 kg (185 lb)  BMI 31.76 kg/m2  SpO2 98%  Fetal Heart Rate: 140 Variability: mod Accelerations: 15bpm Decelerations: none  Contractions: MVUs 180-210  SVE:   Dilation: 9 Effacement (%): 100 Station: 0 Exam by:: dr leggett Manually rotated LOA toward OA and reduced cx : sm ant lip/100/+1-+2, large amt clear AF with maneuver  Pitocin: 12mu  UDS (done d/t lapsed PNC) neg  Assessment / Plan: 35 y.o. G1P0 at [redacted]w[redacted]d here for labor  Labor: progressing  Fetal Wellbeing: Cat1 Pain Control:  fair   [name] [name] 03/19/2011, 4:38 PM  Most BPs normal range but 151/89 and 121/96 so will check preE labs. Depo-Provera 150 mg IM q 3 months 03/19/2011 4:51 PM

## 2011-03-19 NOTE — Progress Notes (Signed)
[name] [name] is a 35 y.o. G1P0 at [redacted]w[redacted]d presented with SOOL  Subjective: Feels pressure w/ UCs. Lt leg numb  Objective: BP 122/78  Pulse 87  Temp(Src) 98.1 F (36.7 C) (Oral)  Resp 22  Ht 5\' 4"  (1.626 m)  Wt 83.915 kg (185 lb)  BMI 31.76 kg/m2  SpO2 98%  Fetal Heart Rate: 125 Variability: mod Accelerations: present Decelerations: prolonged variable after pushing, recovered after FSS  Contractions: q2-3  SVE:   Per Dr. Penne Lash: c/c/+2 ROP, Clear AF  Pitocin: n/a Mag: n/a   Assessment / Plan: 35 y.o. G1P0 at [redacted]w[redacted]d   Labor: Active 2nd stage progressive descent after 1+ hr Preeclampsia:  N/a/ Fetal Wellbeing: Cat2 Pain Control:  adequate   [name] [name] 03/19/2011, 4:17 PM

## 2011-03-19 NOTE — Progress Notes (Signed)
[name] [name] is a 35 y.o. G1P0 at [redacted]w[redacted]d in labor Subjective:   Objective: BP 121/81  Pulse 88  Temp(Src) 98 F (36.7 C) (Axillary)  Resp 18  Ht 5\' 4"  (1.626 m)  Wt 83.915 kg (185 lb)  BMI 31.76 kg/m2  SpO2 98% I/O last 3 completed shifts: In: -  Out: 400 [Urine:400]    FHT:  FHR: 130 bpm, variability: moderate,  accelerations:  Present,  decelerations:  Present variables occasionally UC:   regular, every 2-3 minutes SVE:   8/100/-1 to -2 (feels slightly lower than last exam) Labs: Lab Results  Component Value Date   WBC 10.5 03/18/2011   HGB 13.7 03/18/2011   HCT 38.6 03/18/2011   MCV 90.4 03/18/2011   PLT 224 03/18/2011    Assessment / Plan: Active labor.  MVU are not 200 yet.  RN to increase pitocin to keep MVUs above 200.  Will recheck in 2 hours.  FHT reassuring.   LEGGETT,KELLY H. 03/19/2011, 2:04 PM

## 2011-03-19 NOTE — Consult Note (Signed)
Neonatology Note:   Attendance at C-section:    I was asked to attend this primary C/S at term due to [facility]. The mother is a G1P0 O pos, GBS unknown cigarette smoker with limited PNC. ROM 14 hours prior to delivery, fluid clear. Infant with good  Tone but dusky with eyes staring and glassy. She cried with stimulation. Over the first 2-3 minutes, her color, perfusion and general vigor improved. Needed only minimal bulb suctioning. Ap 7/9. Lungs clear to ausc in DR. To CN to care of Pediatrician.   Deatra James, MD

## 2011-03-19 NOTE — Progress Notes (Signed)
[name] [name] is a 35 y.o. G1P0 at [redacted]w[redacted]d in labor  Subjective:   Objective: BP 132/65  Pulse 82  Temp(Src) 98 F (36.7 C) (Axillary)  Resp 20  Ht 5\' 4"  (1.626 m)  Wt 83.915 kg (185 lb)  BMI 31.76 kg/m2  SpO2 98% I/O last 3 completed shifts: In: -  Out: 400 [Urine:400]    FHT:  FHR: 130 bpm, variability: moderate,  accelerations:  Present,  decelerations:  Absent UC:   regular, every 2-3 minutes SVE:   Dilation: 9 Effacement (%): 100 Station: 0 Exam by:: dr Chastidy Ranker  Labs: Lab Results  Component Value Date   WBC 10.5 03/18/2011   HGB 13.7 03/18/2011   HCT 38.6 03/18/2011   MCV 90.4 03/18/2011   PLT 224 03/18/2011    Assessment / Plan: Made change to 9 cm, with lip on right.  Head now at 0 station and well applied to the cervix.  Contractions have been adequate at times.  Making change now that adequate. Reassuring FHT Continue Pitocin  [name] [name] H. 03/19/2011, 3:45 PM

## 2011-03-19 NOTE — Progress Notes (Signed)
  FHR pattern reassuring. SVE 9/90/-1, AROM lg amt cl fluid. UCs spaced out to 5-7. If does not increase in frequency after ROM will start pit.

## 2011-03-19 NOTE — Anesthesia Preprocedure Evaluation (Addendum)
Anesthesia Evaluation  Patient identified by MRN, date of birth, ID band Patient awake    Reviewed: Allergy & Precautions, H&P , Patient's Chart, lab work & pertinent test results  Airway Mallampati: III TM Distance: >3 FB Neck ROM: full    Dental No notable dental hx. (+) Teeth Intact   Pulmonary neg pulmonary ROS,  clear to auscultation  Pulmonary exam normal       Cardiovascular neg cardio ROS regular Normal    Neuro/Psych Negative Neurological ROS  Negative Psych ROS   GI/Hepatic negative GI ROS, Neg liver ROS,   Endo/Other  Negative Endocrine ROS  Renal/GU negative Renal ROS  Genitourinary negative   Musculoskeletal   Abdominal   Peds  Hematology negative hematology ROS (+)   Anesthesia Other Findings   Reproductive/Obstetrics (+) Pregnancy                           Anesthesia Physical Anesthesia Plan  ASA: II and Emergent  Anesthesia Plan: Epidural   Post-op Pain Management:    Induction:   Airway Management Planned:   Additional Equipment:   Intra-op Plan:   Post-operative Plan:   Informed Consent: I have reviewed the patients History and Physical, chart, labs and discussed the procedure including the risks, benefits and alternatives for the proposed anesthesia with the patient or authorized representative who has indicated his/her understanding and acceptance.     Plan Discussed with: Anesthesiologist  Anesthesia Plan Comments:        Anesthesia Quick Evaluation

## 2011-03-19 NOTE — Progress Notes (Signed)
Called anes - aware pt getting no pain relief after 3 boluses now on 4th - stated that he would be here shortly

## 2011-03-19 NOTE — Transfer of Care (Signed)
Immediate Anesthesia Transfer of Care Note  Patient: [name] [name]  Procedure(s) Performed:  CESAREAN SECTION - primary of baby girl  at 2045  APGAR 7/9  Patient Location: PACU  Anesthesia Type: Epidural  Level of Consciousness: awake, alert  and oriented  Airway & Oxygen Therapy: Patient Spontanous Breathing  Post-op Assessment: Report given to PACU RN and Post -op Vital signs reviewed and stable  Post vital signs: stable  Complications: No apparent anesthesia complications

## 2011-03-19 NOTE — Anesthesia Postprocedure Evaluation (Signed)
  Anesthesia Post-op Note  Patient: [name] [name]  Procedure(s) Performed:  CESAREAN SECTION - primary of baby girl  at 2045  APGAR 7/9  Patient Location: PACU  Anesthesia Type: Epidural  Level of Consciousness: awake, alert  and oriented  Airway and Oxygen Therapy: Patient Spontanous Breathing  Post-op Pain: none  Post-op Assessment: Post-op Vital signs reviewed, Patient's Cardiovascular Status Stable, Respiratory Function Stable, Patent Airway, No signs of Nausea or vomiting, Pain level controlled, No headache and No backache  Post-op Vital Signs: Reviewed and stable  Complications: No apparent anesthesia complications

## 2011-03-19 NOTE — Anesthesia Procedure Notes (Signed)
Epidural Patient location during procedure: OB Start time: 03/19/2011 3:26 AM  Staffing Anesthesiologist: Dorathy Stallone A. Performed by: anesthesiologist   Preanesthetic Checklist Completed: patient identified, site marked, surgical consent, pre-op evaluation, timeout performed, IV checked, risks and benefits discussed and monitors and equipment checked  Epidural Patient position: sitting Prep: site prepped and draped and DuraPrep Patient monitoring: continuous pulse ox and blood pressure Approach: midline Injection technique: LOR air  Needle:  Needle type: Tuohy  Needle gauge: 17 G Needle length: 9 cm Needle insertion depth: 6 cm Catheter type: closed end flexible Catheter size: 19 Gauge Catheter at skin depth: 11 cm Test dose: negative and 1.5% lidocaine  Assessment Events: blood not aspirated, injection not painful, no injection resistance, negative IV test and no paresthesia  Additional Notes Patient is more comfortable after epidural dosed. Please see RN's note for documentation of vital signs and FHR which are stable.

## 2011-03-19 NOTE — Progress Notes (Signed)
[name] [name] is a 35 y.o. G1P0 at [redacted]w[redacted]d admitted for active labor  Subjective:   Objective: BP 122/84  Pulse 75  Temp(Src) 98.3 F (36.8 C) (Oral)  Resp 20  Ht 5\' 4"  (1.626 m)  Wt 83.915 kg (185 lb)  BMI 31.76 kg/m2  SpO2 98% I/O last 3 completed shifts: In: -  Out: 400 [Urine:400]    FHT:  FHR: 120 bpm, variability: moderate,  accelerations:  Present,  decelerations:  Present mild variables UC:   Difficult to determine--not picking up well SVE:   Dilation: 8.5 Effacement (%): 100 Station: -2 Exam by:: dr Cecile Gillispie  Labs: Lab Results  Component Value Date   WBC 10.5 03/18/2011   HGB 13.7 03/18/2011   HCT 38.6 03/18/2011   MCV 90.4 03/18/2011   PLT 224 03/18/2011    Assessment / Plan: Augmentation of labor, progressing well  Labor: Progressing normally Preeclampsia:  no signs or symptoms of toxicity Fetal Wellbeing:  Category II Pain Control:  Epidural I/D:  n/a Anticipated MOD:  NSVD  Will recheck in 2 hrs  Kolby Myung H. 03/19/2011, 11:30 AM

## 2011-03-19 NOTE — Progress Notes (Signed)
[name] [name] is a 35 y.o. G1P0 at [redacted]w[redacted]d  Subjective:  Epidural effective Objective: BP 102/38  Pulse 128  Temp(Src) 99 F (37.2 C) (Oral)  Resp 20  Ht 5\' 4"  (1.626 m)  Wt 83.915 kg (185 lb)  BMI 31.76 kg/m2  SpO2 98%  Fetal Heart Rate: 125 Variability: mod Accelerations: present Decelerations: absent  Contractions: q3  SVE:   Same per Dr. Penne Lash Results for orders placed during the hospital encounter of 03/18/11 (from the past 24 hour(s))  CBC     Status: Normal   Collection Time   03/18/11  9:55 PM      Component Value Range   WBC 10.5  4.0 - 10.5 (K/uL)   RBC 4.27  3.87 - 5.11 (MIL/uL)   Hemoglobin 13.7  12.0 - 15.0 (g/dL)   HCT 21.3  08.6 - 57.8 (%)   MCV 90.4  78.0 - 100.0 (fL)   MCH 32.1  26.0 - 34.0 (pg)   MCHC 35.5  30.0 - 36.0 (g/dL)   RDW 46.9  62.9 - 52.8 (%)   Platelets 224  150 - 400 (K/uL)  RPR     Status: Normal   Collection Time   03/18/11  9:55 PM      Component Value Range   RPR NON REACTIVE  NON REACTIVE   URINE RAPID DRUG SCREEN (HOSP PERFORMED)     Status: Normal   Collection Time   03/18/11 11:25 PM      Component Value Range   Opiates NONE DETECTED  NONE DETECTED    Cocaine NONE DETECTED  NONE DETECTED    Benzodiazepines NONE DETECTED  NONE DETECTED    Amphetamines NONE DETECTED  NONE DETECTED    Tetrahydrocannabinol NONE DETECTED  NONE DETECTED    Barbiturates NONE DETECTED  NONE DETECTED   COMPREHENSIVE METABOLIC PANEL     Status: Abnormal   Collection Time   03/19/11  5:44 PM      Component Value Range   Sodium 133 (*) 135 - 145 (mEq/L)   Potassium 4.4  3.5 - 5.1 (mEq/L)   Chloride 99  96 - 112 (mEq/L)   CO2 23  19 - 32 (mEq/L)   Glucose, Bld 118 (*) 70 - 99 (mg/dL)   BUN 7  6 - 23 (mg/dL)   Creatinine, Ser 4.13  0.50 - 1.10 (mg/dL)   Calcium 9.2  8.4 - 24.4 (mg/dL)   Total Protein 5.9 (*) 6.0 - 8.3 (g/dL)   Albumin 2.8 (*) 3.5 - 5.2 (g/dL)   AST 18  0 - 37 (U/L)   ALT 17  0 - 35 (U/L)   Alkaline Phosphatase 176 (*) 39 -  117 (U/L)   Total Bilirubin 0.5  0.3 - 1.2 (mg/dL)   GFR calc non Af Amer 78 (*) >90 (mL/min)   GFR calc Af Amer >90  >90 (mL/min)   Per RN UOP 150 since 0700, blood-tinged . S/p IVF bolus 500cc Assessment / Plan: 35 y.o. G1P0 at [redacted]w[redacted]d   Labor: 2nd stage  Fetal Wellbeing: FHR reassuring Oliguria  Labile BPs    Madalena Kesecker 03/19/2011, 7:40 PM  Dr. Penne Lash made aware of decreased UOP and increased creatinine P;C ratio pending

## 2011-03-19 NOTE — Progress Notes (Signed)
CNM verbalized coming to unit to break bag of water

## 2011-03-20 ENCOUNTER — Encounter (HOSPITAL_COMMUNITY): Payer: Self-pay | Admitting: Obstetrics & Gynecology

## 2011-03-20 LAB — CBC
MCV: 91.8 fL (ref 78.0–100.0)
Platelets: 166 10*3/uL (ref 150–400)
RBC: 3.43 MIL/uL — ABNORMAL LOW (ref 3.87–5.11)
WBC: 12.5 10*3/uL — ABNORMAL HIGH (ref 4.0–10.5)

## 2011-03-20 MED ORDER — MENTHOL 3 MG MT LOZG: 1.0000 | LOZENGE | OROMUCOSAL | Status: DC | PRN

## 2011-03-20 MED ORDER — SODIUM CHLORIDE 0.9 % IJ SOLN: 3.0000 mL | INTRAMUSCULAR | Status: DC | PRN

## 2011-03-20 MED ORDER — ONDANSETRON HCL 4 MG PO TABS: 4.0000 mg | ORAL_TABLET | ORAL | Status: DC | PRN

## 2011-03-20 MED ORDER — NALBUPHINE HCL 10 MG/ML IJ SOLN
5.0000 mg | INTRAMUSCULAR | Status: DC | PRN
  Filled 2011-03-20: qty 1

## 2011-03-20 MED ORDER — IBUPROFEN 600 MG PO TABS: 600.0000 mg | ORAL_TABLET | Freq: Four times a day (QID) | ORAL | Status: DC | PRN

## 2011-03-20 MED ORDER — METOCLOPRAMIDE HCL 5 MG/ML IJ SOLN: 10.0000 mg | Freq: Three times a day (TID) | INTRAMUSCULAR | Status: DC | PRN

## 2011-03-20 MED ORDER — OXYTOCIN 20 UNITS IN LACTATED RINGERS INFUSION - SIMPLE: 125.0000 mL/h | INTRAVENOUS | Status: AC

## 2011-03-20 MED ORDER — IBUPROFEN 600 MG PO TABS
600.0000 mg | ORAL_TABLET | Freq: Four times a day (QID) | ORAL | Status: DC
  Administered 2011-03-20 – 2011-03-22 (×9): 600 mg via ORAL
  Filled 2011-03-20 (×9): qty 1

## 2011-03-20 MED ORDER — SODIUM CHLORIDE 0.9 % IV SOLN
1.0000 ug/kg/h | INTRAVENOUS | Status: DC | PRN
  Filled 2011-03-20: qty 2.5

## 2011-03-20 MED ORDER — NALOXONE HCL 0.4 MG/ML IJ SOLN: 0.4000 mg | INTRAMUSCULAR | Status: DC | PRN

## 2011-03-20 MED ORDER — WITCH HAZEL-GLYCERIN EX PADS: 1.0000 "application " | MEDICATED_PAD | CUTANEOUS | Status: DC | PRN

## 2011-03-20 MED ORDER — MEASLES, MUMPS & RUBELLA VAC ~~LOC~~ INJ
0.5000 mL | INJECTION | Freq: Once | SUBCUTANEOUS | Status: DC
  Filled 2011-03-20: qty 0.5

## 2011-03-20 MED ORDER — LACTATED RINGERS IV SOLN
INTRAVENOUS | Status: DC
  Administered 2011-03-20 (×2): via INTRAVENOUS

## 2011-03-20 MED ORDER — DIBUCAINE 1 % RE OINT: 1.0000 "application " | TOPICAL_OINTMENT | RECTAL | Status: DC | PRN

## 2011-03-20 MED ORDER — LANOLIN HYDROUS EX OINT: 1.0000 "application " | TOPICAL_OINTMENT | CUTANEOUS | Status: DC | PRN

## 2011-03-20 MED ORDER — ONDANSETRON HCL 4 MG/2ML IJ SOLN: 4.0000 mg | INTRAMUSCULAR | Status: DC | PRN

## 2011-03-20 MED ORDER — DIPHENHYDRAMINE HCL 25 MG PO CAPS: 25.0000 mg | ORAL_CAPSULE | Freq: Four times a day (QID) | ORAL | Status: DC | PRN

## 2011-03-20 MED ORDER — DIPHENHYDRAMINE HCL 50 MG/ML IJ SOLN: 12.5000 mg | INTRAMUSCULAR | Status: DC | PRN

## 2011-03-20 MED ORDER — DIPHENHYDRAMINE HCL 50 MG/ML IJ SOLN: 25.0000 mg | INTRAMUSCULAR | Status: DC | PRN

## 2011-03-20 MED ORDER — DIPHENHYDRAMINE HCL 25 MG PO CAPS: 25.0000 mg | ORAL_CAPSULE | ORAL | Status: DC | PRN

## 2011-03-20 MED ORDER — ONDANSETRON HCL 4 MG/2ML IJ SOLN: 4.0000 mg | Freq: Three times a day (TID) | INTRAMUSCULAR | Status: DC | PRN

## 2011-03-20 MED ORDER — SIMETHICONE 80 MG PO CHEW
80.0000 mg | CHEWABLE_TABLET | ORAL | Status: DC | PRN
  Administered 2011-03-20 – 2011-03-21 (×4): 80 mg via ORAL

## 2011-03-20 MED ORDER — PRENATAL PLUS 27-1 MG PO TABS
1.0000 | ORAL_TABLET | Freq: Every day | ORAL | Status: DC
  Administered 2011-03-20 – 2011-03-22 (×3): 1 via ORAL
  Filled 2011-03-20 (×3): qty 1

## 2011-03-20 MED ORDER — TETANUS-DIPHTH-ACELL PERTUSSIS 5-2.5-18.5 LF-MCG/0.5 IM SUSP
0.5000 mL | Freq: Once | INTRAMUSCULAR | Status: AC
  Administered 2011-03-21: 0.5 mL via INTRAMUSCULAR
  Filled 2011-03-20: qty 0.5

## 2011-03-20 MED ORDER — OXYCODONE-ACETAMINOPHEN 5-325 MG PO TABS
1.0000 | ORAL_TABLET | ORAL | Status: DC | PRN
  Administered 2011-03-20 – 2011-03-22 (×4): 1 via ORAL
  Filled 2011-03-20 (×4): qty 1

## 2011-03-20 NOTE — Anesthesia Postprocedure Evaluation (Signed)
  Anesthesia Post-op Note  Patient: [name] [name]  Procedure(s) Performed:  CESAREAN SECTION - primary of baby girl  at 2045  APGAR 7/9  Patient Location: Mother/Baby  Anesthesia Type: Epidural  Level of Consciousness: awake, alert  and oriented  Airway and Oxygen Therapy: Patient Spontanous Breathing  Post-op Pain: none  Post-op Assessment: Post-op Vital signs reviewed, Patient's Cardiovascular Status Stable, No headache, No backache, No residual numbness and No residual motor weakness  Post-op Vital Signs: Reviewed and stable  Complications: No apparent anesthesia complications

## 2011-03-20 NOTE — Progress Notes (Signed)
UR chart review completed.  

## 2011-03-20 NOTE — Addendum Note (Signed)
Addendum  created 03/20/11 0827 by Madison Hickman   Modules edited:Notes Section

## 2011-03-20 NOTE — Progress Notes (Signed)
[name] [name] is a 35 YO G1P1001 at [redacted]w[redacted]d who is POD#1: cesarean delivery due to failure to progress: arrest of descent.  Subjective: Postpartum Day 1: Cesarean Delivery Patient reports nausea, incisional pain, tolerating PO and + flatus.  Patient reports being able to ambulate, feels much better after operation. Denies pain in legs, CP, or SOB.   Objective: Vital signs in last 24 hours: Temp:  [97.8 F (36.6 C)-99.4 F (37.4 C)] 98 F (36.7 C) (11/19 0605) Pulse Rate:  [63-128] 75  (11/19 0605) Resp:  [16-26] 24  (11/19 0605) BP: (81-151)/(38-96) 90/54 mmHg (11/19 0605) SpO2:  [88 %-100 %] 97 % (11/19 4098)  Physical Exam:  General: alert, cooperative and no distress, blood pressures stable overnight (120's/70's) Heart: regular rate rhythm, no murmurs, rubs, gallops Lungs: clear to auscultation bilaterally. No wheezes, rhonchi, crackles Lochia: appropriate Abdomen: tender below umbilicus proximal to incision site. Denies pain with palpation to upper quadrants.  Uterine Fundus: firm Incision: healing well, no erythema or signs of infection DVT Evaluation: No cords or calf tenderness.  Results for orders placed during the hospital encounter of 03/18/11 (from the past 24 hour(s))  COMPREHENSIVE METABOLIC PANEL     Status: Abnormal   Collection Time   03/19/11  5:44 PM      Component Value Range   Sodium 133 (*) 135 - 145 (mEq/L)   Potassium 4.4  3.5 - 5.1 (mEq/L)   Chloride 99  96 - 112 (mEq/L)   CO2 23  19 - 32 (mEq/L)   Glucose, Bld 118 (*) 70 - 99 (mg/dL)   BUN 7  6 - 23 (mg/dL)   Creatinine, Ser 1.19  0.50 - 1.10 (mg/dL)   Calcium 9.2  8.4 - 14.7 (mg/dL)   Total Protein 5.9 (*) 6.0 - 8.3 (g/dL)   Albumin 2.8 (*) 3.5 - 5.2 (g/dL)   AST 18  0 - 37 (U/L)   ALT 17  0 - 35 (U/L)   Alkaline Phosphatase 176 (*) 39 - 117 (U/L)   Total Bilirubin 0.5  0.3 - 1.2 (mg/dL)   GFR calc non Af Amer 78 (*) >90 (mL/min)   GFR calc Af Amer >90  >90 (mL/min)  PROTEIN / CREATININE RATIO,  URINE     Status: Abnormal   Collection Time   03/19/11  5:53 PM      Component Value Range   Creatinine, Urine 141.02     Total Protein, Urine 642.9     PROTEIN CREATININE RATIO 4.56 (*) 0.00 - 0.15   CBC     Status: Abnormal   Collection Time   03/20/11  5:35 AM      Component Value Range   WBC 12.5 (*) 4.0 - 10.5 (K/uL)   RBC 3.43 (*) 3.87 - 5.11 (MIL/uL)   Hemoglobin 10.9 (*) 12.0 - 15.0 (g/dL)   HCT 82.9 (*) 56.2 - 46.0 (%)   MCV 91.8  78.0 - 100.0 (fL)   MCH 31.8  26.0 - 34.0 (pg)   MCHC 34.6  30.0 - 36.0 (g/dL)   RDW 13.0  86.5 - 78.4 (%)   Platelets 166  150 - 400 (K/uL)   Basename 03/20/11 0535 03/18/11 2155  HGB 10.9* 13.7  HCT 31.5* 38.6   Specimen Collected: 03/19/11 5:53 PM Creatinine, Urine 141.02 mg/dL  Total Protein, Urine 696.2 mg/dL  PROTEIN CREATININE RATIO 4.56 (H) reference: 0.00 - 0.15  Assessment/Plan: Status post Cesarean section. Doing well postoperatively.  Continue current care.  Assess creatinine  ratio: patient has foley placed, monitor ins/outs. Patient recently tolerating PO intake of food and water.   [name], PA-S 03/20/2011, 7:34 AM   I have seen and examined the patient and agree with excellent PA student note.  Patient with poor urine output during c-section. Had bloody urine at the time-believe Pr/cr ratio falsely elevated by blood in urine. Will encourage PO.  Discussed on rounds and as patient already approximately 12 hours out-will not start magnesium at this time for preeclampsia, especially considering hypertension only occurred during delivery. Will monitor blood pressures.   Tana Conch, MD, PGY1 03/20/2011 9:19 AM

## 2011-03-21 MED ORDER — INFLUENZA VIRUS VACC SPLIT PF IM SUSP
0.5000 mL | INTRAMUSCULAR | Status: AC | PRN
  Administered 2011-03-22: 0.5 mL via INTRAMUSCULAR
  Filled 2011-03-21: qty 0.5

## 2011-03-21 NOTE — Progress Notes (Signed)
Subjective: Postpartum Day 2: Cesarean Delivery Patient reports incisional pain, tolerating PO, + flatus and no problems voiding.                            Bottle-Feeding Objective: Vital signs in last 24 hours: Temp:  [97.9 F (36.6 C)-98.4 F (36.9 C)] 98 F (36.7 C) (11/20 0602) Pulse Rate:  [77-90] 77  (11/20 0602) Resp:  [18-20] 18  (11/20 0602) BP: (81-125)/(55-77) 81/55 mmHg (11/20 0602) SpO2:  [97 %-99 %] 97 % (11/19 2014)  Physical Exam:  General: alert and no distress Lochia: appropriate Uterine Fundus: firm Incision: healing well, no significant drainage, no dehiscence, no significant erythema DVT Evaluation: No evidence of DVT seen on physical exam.   Basename 03/20/11 0535 03/18/11 2155  HGB 10.9* 13.7  HCT 31.5* 38.6    Assessment/Plan: Status post Cesarean section. Doing well postoperatively.  Continue current care Anticipate Discharge tomorrow.  [facility] 03/21/2011, 10:42 AM

## 2011-03-22 MED ORDER — IBUPROFEN 600 MG PO TABS: 600.0000 mg | ORAL_TABLET | Freq: Four times a day (QID) | ORAL | Status: AC

## 2011-03-22 MED ORDER — OXYCODONE-ACETAMINOPHEN 5-325 MG PO TABS: 1.0000 | ORAL_TABLET | Freq: Four times a day (QID) | ORAL | Status: AC | PRN

## 2011-03-22 NOTE — Progress Notes (Signed)
Subjective: Postpartum Day 3: Cesarean Delivery Patient reports incisional pain, tolerating PO, + flatus, + BM and no problems voiding.  Pain at and above the incision is present during movement only. Patient reports it is tolerable and getting better.   Objective: Vital signs in last 24 hours: Temp:  [97.6 F (36.4 C)-98.1 F (36.7 C)] 97.6 F (36.4 C) (11/21 0615) Pulse Rate:  [75-97] 83  (11/21 0615) Resp:  [20] 20  (11/21 0615) BP: (93-124)/(59-78) 115/78 mmHg (11/21 0615) SpO2:  [96 %-99 %] 99 % (11/21 0615)  Physical Exam:  General: alert, cooperative and no distress Heart: regular rate and rhythm. No murmurs, rubs, gallops. Lungs: clear to auscultation bilaterally with no wheezes, rhonchi, crackles while seated. Some wheezing anteriorly on the right sternal border while supine. Patient reports mild on-going cough. Abdomen: tender with palpation lower abdominal areas above the incision. Incision is also tender, but not erythematous. Upper abdomen soft, nontender. Lochia: appropriate Uterine Fundus: firm Incision: healing well, no significant drainage, no dehiscence, no significant erythema DVT Evaluation: No cords or calf tenderness.   Basename 03/20/11 0535  HGB 10.9*  HCT 31.5*    Assessment/Plan: Status post Cesarean section. Doing well postoperatively. Patient's blood pressure and temperature have been stable and within normal limits. Staples can be removed today. Plan to be discharged home following. Patient continues to bottle feed.   [name], PA-S 03/22/2011, 7:41 AM   I have seen and examined the patient and agree with excellent PA student note. No alterations required.   Tana Conch, MD, PGY1 03/22/2011 12:52 PM

## 2011-03-22 NOTE — Discharge Summary (Signed)
Obstetric Discharge Summary Reason for Admission: onset of labor Prenatal Procedures: NST Intrapartum Procedures: cesarean: low cervical, transverse Postpartum Procedures: none Complications-Operative and Postpartum: none Hemoglobin  Date Value Range Status  03/20/2011 10.9* 12.0-15.0 (g/dL) Final     DELTA CHECK NOTED     REPEATED TO VERIFY     HCT  Date Value Range Status  03/20/2011 31.5* 36.0-46.0 (%) Final    Discharge Diagnoses: Term Pregnancy-delivered [name] [name] is a 35 YO G1P1001 at [redacted]w[redacted]d presented in labor went on to have primary low transverse cesarean section secondary to failure to progress: arrest of descent. Epidural for anesthesia. 3 vessel cord with manual removal of placenta. No lacerations. Apgar's 7 & 9. Mom is bottle feeding, plans to abstain from intercourse, no plans for birth control.   Discharge Information: Date: 03/22/2011 Activity: pelvic rest Diet: routine Medications: Ibuprofen, Colace and Percocet Condition: stable Instructions: refer to practice specific booklet Discharge to: home Follow-up Information    Follow up with [facility] on 03/27/2011. (appointment at 9:00 am - remove staples)    Contact information:   [address] [place] [address]          Newborn Data: Live born female  Birth Weight: 6 lb 9.8 oz (3000 g) APGAR: 7, 9  Home with mother.  [name] Putt, PA-S 03/22/2011, 9:37 AM  I have seen and examined the patient and agree with excellent PA student discharge summary. No alterations required.    [name] Conch, MD, PGY1 03/22/2011 12:55 PM

## 2011-03-29 NOTE — Op Note (Signed)
Present for operation

## 2011-08-09 IMAGING — CR DG ANKLE COMPLETE 3+V*L*
3 series · 3 of 3 positions shown · non-contrast
Comparison: None.

CLINICAL DATA: Left ankle pain, swelling.

LEFT ANKLE COMPLETE - 3+ VIEW

[t ankle joint ap left]
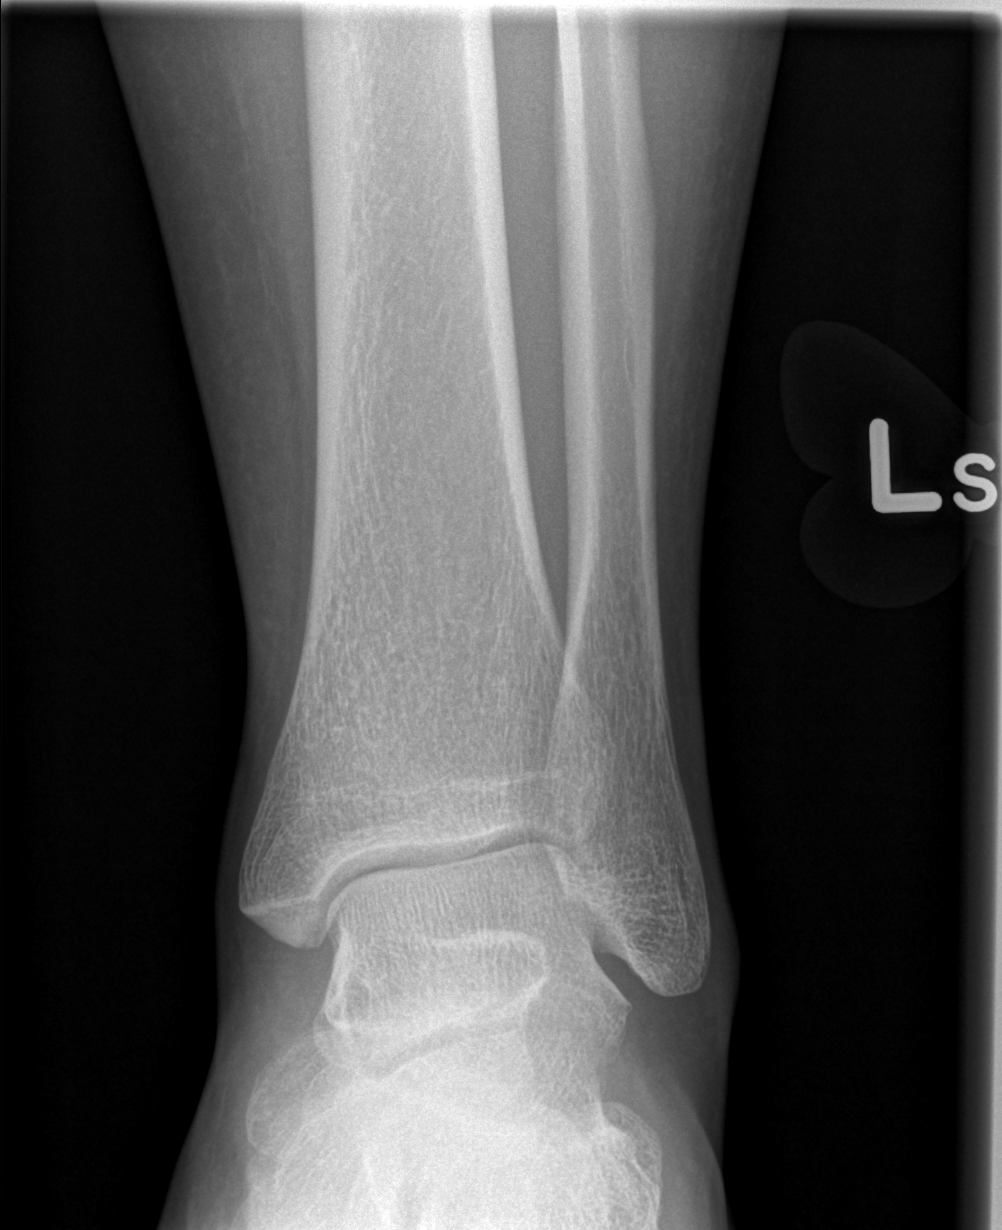

[t ankle joint oblique left]
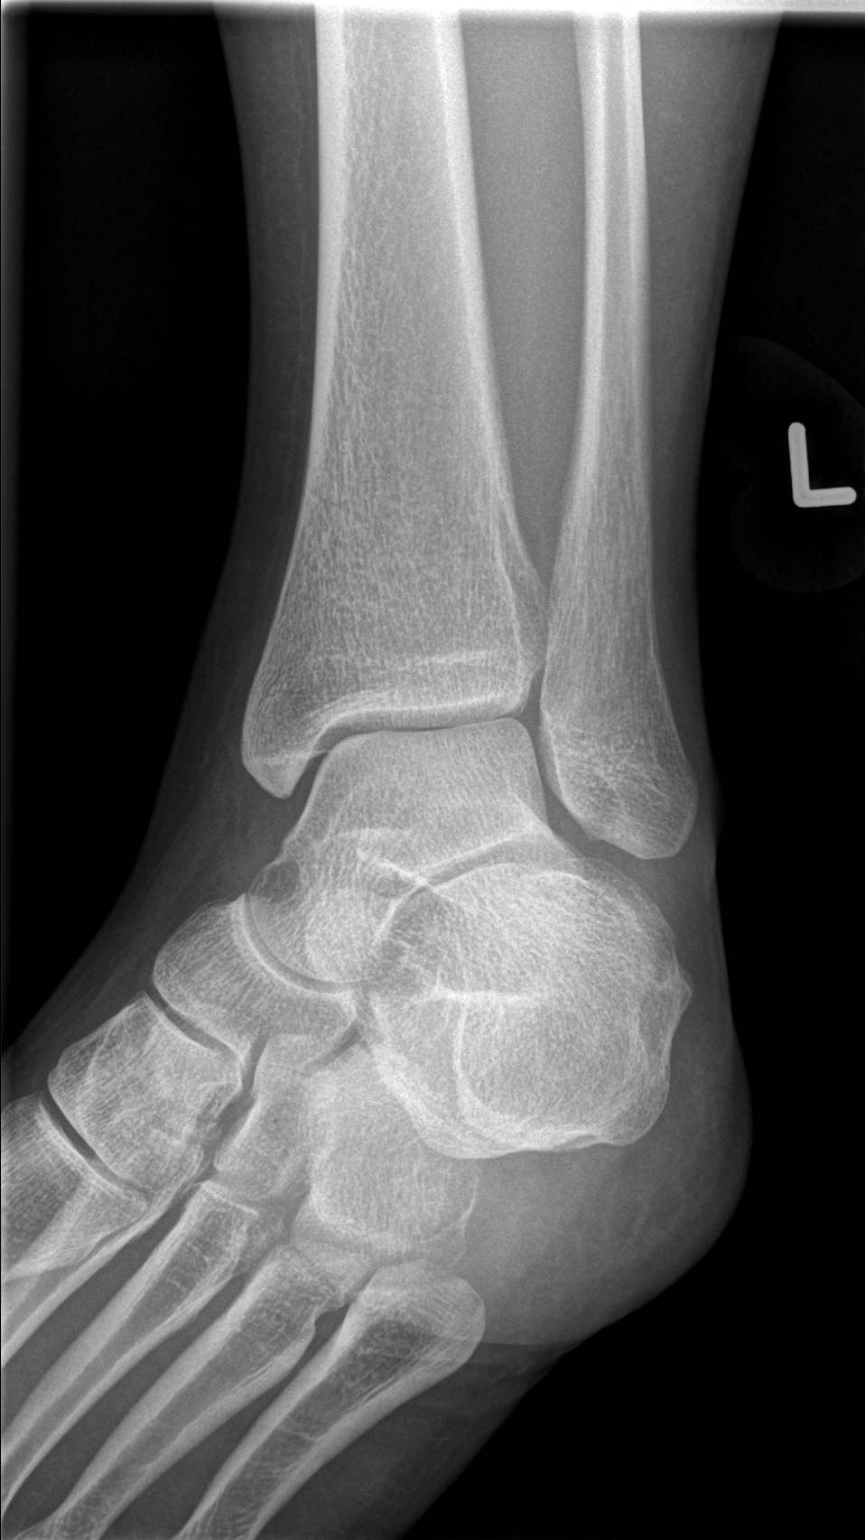

[t ankle joint lat left]
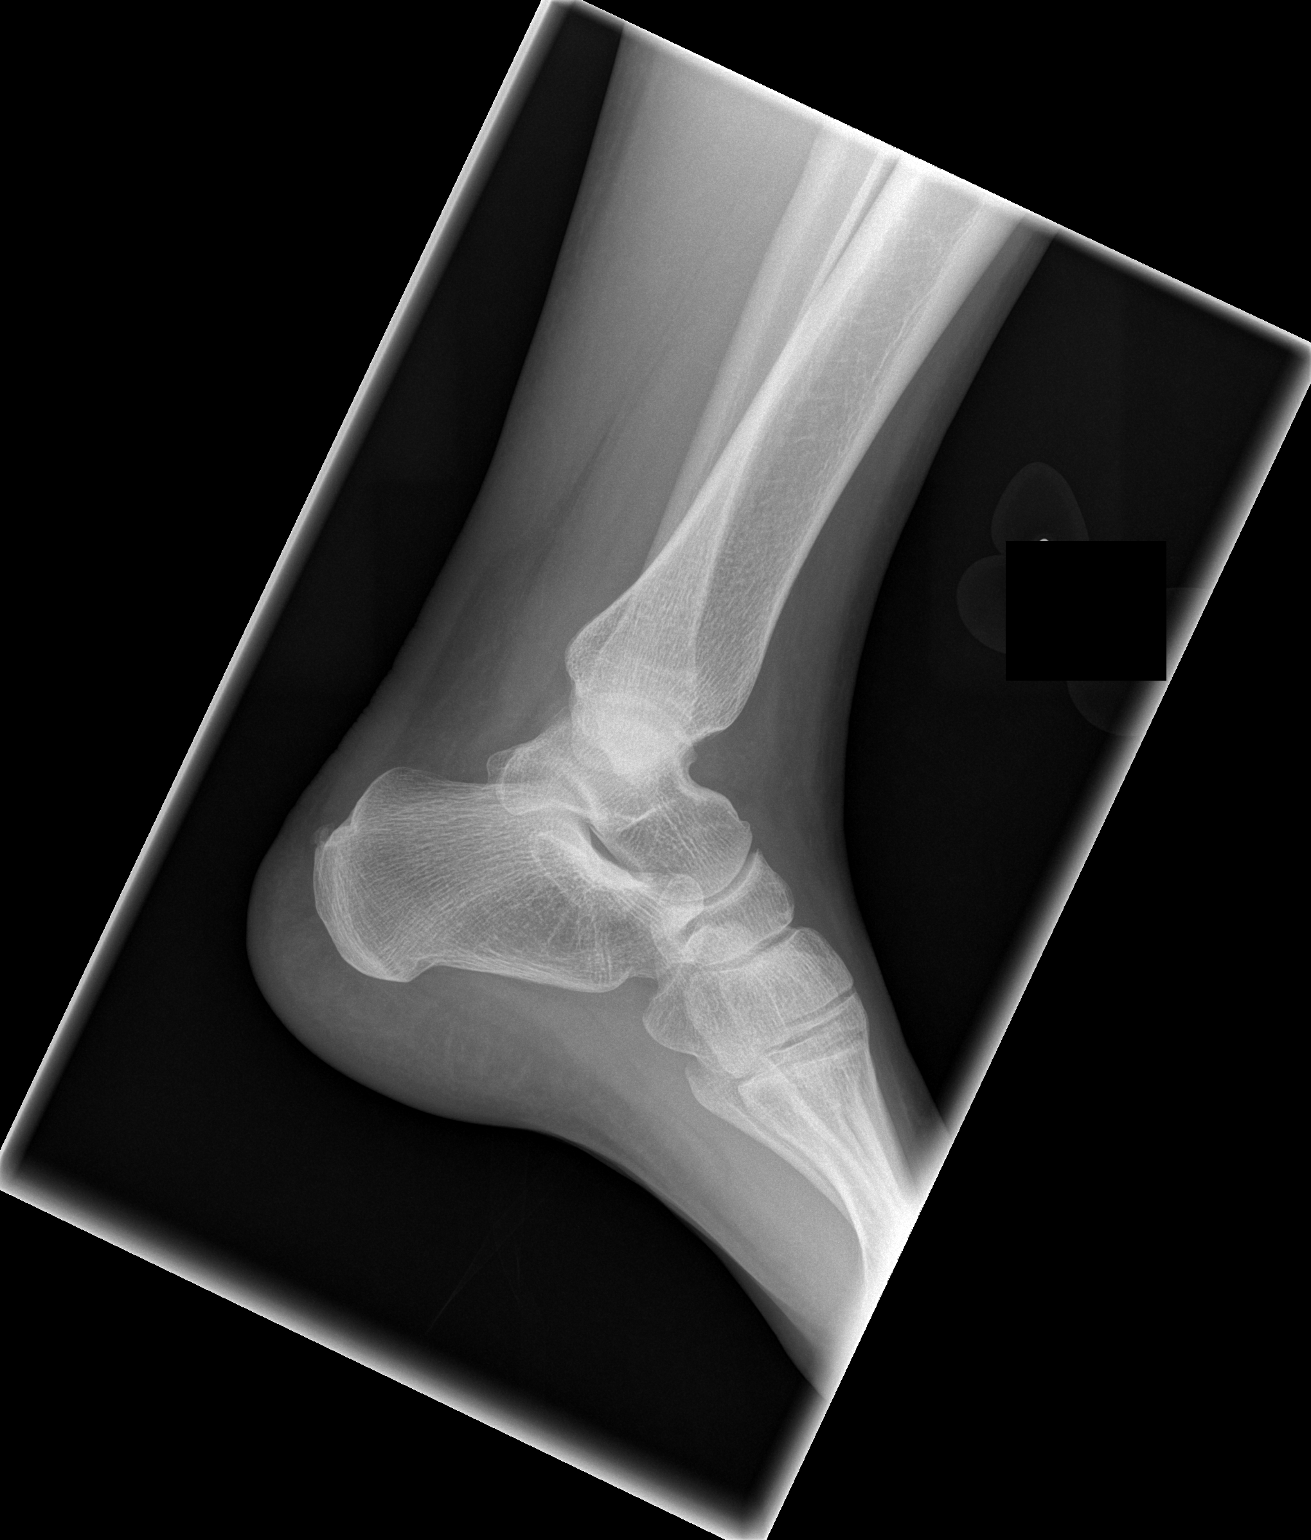

[3 of 3 positions shown; findings below may reference images not displayed]

FINDINGS: No acute bony abnormality.  Specifically, no fracture,
subluxation, or dislocation.  Soft tissues are intact.
IMPRESSION: No acute bony abnormality.

## 2013-10-03 ENCOUNTER — Encounter (HOSPITAL_COMMUNITY): Payer: Self-pay | Admitting: Emergency Medicine

## 2013-10-03 ENCOUNTER — Emergency Department (HOSPITAL_COMMUNITY)
Admission: EM | Admit: 2013-10-03 | Discharge: 2013-10-03 | Disposition: A | Discharge status: Home / Self Care | Payer: Medicare Other | Attending: Emergency Medicine | Admitting: Emergency Medicine

## 2013-10-03 DIAGNOSIS — Y9389 Activity, other specified: Secondary | ICD-10-CM | POA: Insufficient documentation

## 2013-10-03 DIAGNOSIS — Y93G3 Activity, cooking and baking: Secondary | ICD-10-CM | POA: Insufficient documentation

## 2013-10-03 DIAGNOSIS — Z23 Encounter for immunization: Secondary | ICD-10-CM | POA: Insufficient documentation

## 2013-10-03 DIAGNOSIS — W260XXA Contact with knife, initial encounter: Secondary | ICD-10-CM | POA: Insufficient documentation

## 2013-10-03 DIAGNOSIS — S61219A Laceration without foreign body of unspecified finger without damage to nail, initial encounter: Secondary | ICD-10-CM

## 2013-10-03 DIAGNOSIS — I10 Essential (primary) hypertension: Secondary | ICD-10-CM | POA: Insufficient documentation

## 2013-10-03 DIAGNOSIS — W261XXA Contact with sword or dagger, initial encounter: Secondary | ICD-10-CM

## 2013-10-03 DIAGNOSIS — Z87891 Personal history of nicotine dependence: Secondary | ICD-10-CM | POA: Insufficient documentation

## 2013-10-03 DIAGNOSIS — S61209A Unspecified open wound of unspecified finger without damage to nail, initial encounter: Secondary | ICD-10-CM | POA: Insufficient documentation

## 2013-10-03 DIAGNOSIS — S6000XA Contusion of unspecified finger without damage to nail, initial encounter: Secondary | ICD-10-CM | POA: Insufficient documentation

## 2013-10-03 DIAGNOSIS — Y9289 Other specified places as the place of occurrence of the external cause: Secondary | ICD-10-CM | POA: Insufficient documentation

## 2013-10-03 MED ORDER — CEPHALEXIN 500 MG PO CAPS: 500.0000 mg | ORAL_CAPSULE | Freq: Four times a day (QID) | ORAL | Status: DC

## 2013-10-03 MED ORDER — IBUPROFEN 400 MG PO TABS
800.0000 mg | ORAL_TABLET | Freq: Once | ORAL | Status: AC
  Administered 2013-10-03: 800 mg via ORAL
  Filled 2013-10-03: qty 2

## 2013-10-03 MED ORDER — TETANUS-DIPHTH-ACELL PERTUSSIS 5-2.5-18.5 LF-MCG/0.5 IM SUSP
0.5000 mL | Freq: Once | INTRAMUSCULAR | Status: AC
  Administered 2013-10-03: 0.5 mL via INTRAMUSCULAR
  Filled 2013-10-03: qty 0.5

## 2013-10-03 NOTE — ED Notes (Signed)
Pt sts while cooking last night she cut her pinky on right hand. Bleeding stopped. Band aid over laceration. Pt has some slight swelling to pinky. Nad, skin warm and dry, resp e/u.

## 2013-10-03 NOTE — ED Provider Notes (Signed)
CSN: [id number]     Arrival date & time 10/03/13  1317 History  This chart was scribed for non-physician practitioner working with Flint Melter, MD, by Bronson Curb, ED Scribe. This patient was seen in room TR08C/TR08C and the patient's care was started at 1:55 PM.    Chief Complaint  Patient presents with  . Extremity Laceration      The history is provided by the patient. No language interpreter was used.    HPI Comments: [name] [name] is a 38 y.o. female who presents to the Emergency Department for right 5th finger laceration that occurred at yesterday at 8 PM. Patient states she was cooking when she accidentally cut her 5th finger with a knife. There is associated bruising and pain with flexion. The wound is no longer bleeding. Patient rates her pain as 8/10, but reports sensation throughout her finger. She states she has cleaned the wound with alcohol swabs. Patient is left hand dominant. Patient is not UTD on tetanus.   Past Medical History  Diagnosis Date  . Hypertension    Past Surgical History  Procedure Laterality Date  . Cesarean section  03/19/2011    Procedure: CESAREAN SECTION;  Surgeon: Lesly Dukes, MD;  Location: [facility];  Service: Gynecology;  Laterality: N/A;  primary of baby girl  at 2045  APGAR 7/9   Family History  Problem Relation Age of Onset  . Diabetes Mother    History  Substance Use Topics  . Smoking status: Former Smoker -- 1.00 packs/day for 17 years    Types: Cigarettes    Quit date: 02/15/2011  . Smokeless tobacco: Never Used  . Alcohol Use: No   OB History   Grav Para Term Preterm Abortions TAB SAB Ect Mult Living   1 1 1       1      Review of Systems  Constitutional: Negative for fever.  Musculoskeletal: Negative for myalgias.  Skin: Positive for wound. Negative for rash.  Allergic/Immunologic: Negative for immunocompromised state.  Neurological: Negative for weakness and numbness.  Hematological: Does not bruise/bleed  easily.      Allergies  Erythromycin  Home Medications   Prior to Admission medications   Not on File   Triage Vitals: BP 130/77  Pulse 88  Temp(Src) 97.6 F (36.4 C) (Oral)  Resp 16  Ht 5\' 4"  (1.626 m)  Wt 200 lb (90.719 kg)  BMI 34.31 kg/m2  SpO2 100%  LMP 09/14/2013  Physical Exam  Nursing note and vitals reviewed. Constitutional: She appears well-developed and well-nourished. No distress.  HENT:  Head: Normocephalic and atraumatic.  Neck: Neck supple.  Pulmonary/Chest: Effort normal.  Musculoskeletal:  Right 5th finger palmar aspect with small laceration approximately 1 cm. hemostatic. Small area of ecchymosis. No erythema, edema, warmth, or discharge. Full active ROM of the extremity. Sensation intact. Capillary refill less than 2 seconds. No other noted injuries of hand. Radial pulses intact. No bony tenderness.  Neurological: She is alert.  Skin: She is not diaphoretic.  See musculoskeletal.    ED Course  Procedures (including critical care time)  DIAGNOSTIC STUDIES: Oxygen Saturation is 100% on room air, normal by my interpretation.    COORDINATION OF CARE: At 1401 Discussed treatment plan with patient which includes ibuprofen and Keflex. Patient agrees.   Labs Review Labs Reviewed - No data to display  Imaging Review No results found.   EKG Interpretation None      MDM   Final diagnoses:  Finger  laceration    Pt with laceration to palmar aspect 5th finger on nondominant hand.  Neurovascularly intact.  No tendon involvement. No bony tenderness. No e/o infection.  Wound is small and too old to suture safely.  Wound cleaned and dressed and pt d/c home with keflex.  Discussed  findings, treatment, and follow up  with patient.  Pt given return precautions.  Pt verbalizes understanding and agrees with plan.      I personally performed the services described in this documentation, which was scribed in my presence. The recorded information has been  reviewed and is accurate.    Trixie Dredgemily Rossie Bretado, PA-C 10/03/13 [phone]

## 2013-10-03 NOTE — Discharge Instructions (Signed)
Read the information below.  Use the prescribed medication as directed.  Please discuss all new medications with your pharmacist.  You may return to the Emergency Department at any time for worsening condition or any new symptoms that concern you.     If you develop redness, swelling, pus draining from the wound, or fevers greater than 100.4, return to the ER immediately for a recheck.   °

## 2013-10-03 NOTE — ED Notes (Signed)
Pt refused W/C at D/C ,pt escorted to lobby by RN

## 2013-10-04 NOTE — ED Provider Notes (Signed)
Medical screening examination/treatment/procedure(s) were performed by non-physician practitioner and as supervising physician I was immediately available for consultation/collaboration.   EKG Interpretation None       Tavien Chestnut L Roland Lipke, MD 10/04/13 0854 

## 2014-03-02 ENCOUNTER — Encounter (HOSPITAL_COMMUNITY): Payer: Self-pay | Admitting: Emergency Medicine

## 2014-03-10 ENCOUNTER — Encounter (HOSPITAL_COMMUNITY): Payer: Self-pay | Admitting: Emergency Medicine

## 2014-03-10 ENCOUNTER — Emergency Department (HOSPITAL_COMMUNITY)
Admission: EM | Admit: 2014-03-10 | Discharge: 2014-03-10 | Disposition: A | Discharge status: Home / Self Care | Payer: Medicare Other | Attending: Emergency Medicine | Admitting: Emergency Medicine

## 2014-03-10 DIAGNOSIS — M79672 Pain in left foot: Secondary | ICD-10-CM | POA: Diagnosis present

## 2014-03-10 DIAGNOSIS — Z79899 Other long term (current) drug therapy: Secondary | ICD-10-CM | POA: Insufficient documentation

## 2014-03-10 DIAGNOSIS — Z87891 Personal history of nicotine dependence: Secondary | ICD-10-CM | POA: Insufficient documentation

## 2014-03-10 DIAGNOSIS — B070 Plantar wart: Secondary | ICD-10-CM | POA: Insufficient documentation

## 2014-03-10 DIAGNOSIS — I10 Essential (primary) hypertension: Secondary | ICD-10-CM | POA: Insufficient documentation

## 2014-03-10 NOTE — ED Notes (Signed)
Left foot: stepped on something (glass she thinks). No drainage, no warmth.

## 2014-03-10 NOTE — ED Notes (Signed)
Pt thinks she may have something in bottom of left foot.  St's it has been there approx 3 weeks

## 2014-03-10 NOTE — Discharge Instructions (Signed)
Follow up with the recommended podiatrist for further evaluation and removal of your wart. Refer to attached documents for more information.

## 2014-03-10 NOTE — ED Provider Notes (Signed)
CSN: [id number]     Arrival date & time 03/10/14  1451 History  This chart was scribed for non-physician practitioner, Emilia BeckKaitlyn Kaliana Albino, PA-C working with Ward GivensIva L Knapp, MD by Greggory StallionKayla Andersen, ED scribe. This patient was seen in room TR08C/TR08C and the patient's care was started at 3:41 PM.    Chief Complaint  Patient presents with  . Foot Pain   The history is provided by the patient. No language interpreter was used.    HPI Comments: [name] ReidSusan R [name] is a 38 y.o. female who presents to the Emergency Department complaining of left foot pain that started 3 weeks ago. She thinks she might have stepped on something but doesn't recall ever actually doing that. Bearing weight worsens the pain. She has done warm soaks with epsom salt with no relief.   Past Medical History  Diagnosis Date  . Hypertension    Past Surgical History  Procedure Laterality Date  . Cesarean section  03/19/2011    Procedure: CESAREAN SECTION;  Surgeon: Lesly DukesKelly H. Leggett, MD;  Location: [facility];  Service: Gynecology;  Laterality: N/A;  primary of baby girl  at 2045  APGAR 7/9   Family History  Problem Relation Age of Onset  . Diabetes Mother    History  Substance Use Topics  . Smoking status: Former Smoker -- 1.00 packs/day for 17 years    Types: Cigarettes    Quit date: 02/15/2011  . Smokeless tobacco: Never Used  . Alcohol Use: No   OB History    Gravida Para Term Preterm AB TAB SAB Ectopic Multiple Living   1 1 1       1      Review of Systems  Skin: Positive for wound.  All other systems reviewed and are negative.  Allergies  Erythromycin  Home Medications   Prior to Admission medications   Medication Sig Start Date End Date Taking? Authorizing Provider  cephALEXin (KEFLEX) 500 MG capsule Take 1 capsule (500 mg total) by mouth 4 (four) times daily. 10/03/13   Trixie DredgeEmily West, PA-C   BP 124/82 mmHg  Pulse 85  Temp(Src) 98.4 F (36.9 C) (Oral)  Resp 16  Ht 5\' 4"  (1.626 m)  Wt 190 lb (86.183 kg)  BMI  32.60 kg/m2  SpO2 96%  LMP 03/05/2014   Physical Exam  Constitutional: She is oriented to person, place, and time. She appears well-developed and well-nourished. No distress.  HENT:  Head: Normocephalic and atraumatic.  Eyes: Conjunctivae and EOM are normal.  Neck: Neck supple. No tracheal deviation present.  Cardiovascular: Normal rate.   Pulmonary/Chest: Effort normal. No respiratory distress.  Musculoskeletal: Normal range of motion.  Neurological: She is alert and oriented to person, place, and time.  Skin: Skin is warm and dry.  Plantar wart noted to the pad of left foot between second and third metatarsals.   Psychiatric: She has a normal mood and affect. Her behavior is normal.  Nursing note and vitals reviewed.   ED Course  Procedures (including critical care time)  DIAGNOSTIC STUDIES: Oxygen Saturation is 96% on RA, normal by my interpretation.    COORDINATION OF CARE: 3:43 PM-Discussed treatment plan which includes podiatry referral with pt at bedside and pt agreed to plan.   Labs Review Labs Reviewed - No data to display  Imaging Review No results found.   EKG Interpretation None      MDM   Final diagnoses:  Plantar wart of left foot    3:45 PM Patient has plantar  wart of left foot. Patient will have podiatry referral. No injury.   I personally performed the services described in this documentation, which was scribed in my presence. The recorded information has been reviewed and is accurate.  Emilia BeckKaitlyn Nadiya Pieratt, PA-C 03/10/14 1546  Ward GivensIva L Knapp, MD 03/10/14 1546

## 2014-03-18 ENCOUNTER — Ambulatory Visit (INDEPENDENT_AMBULATORY_CARE_PROVIDER_SITE_OTHER): Payer: Medicare Other | Admitting: Podiatry

## 2014-03-18 ENCOUNTER — Ambulatory Visit (INDEPENDENT_AMBULATORY_CARE_PROVIDER_SITE_OTHER): Payer: Medicare Other

## 2014-03-18 ENCOUNTER — Encounter: Payer: Self-pay | Admitting: Podiatry

## 2014-03-18 VITALS — BP 115/64 | HR 86 | Resp 12

## 2014-03-18 DIAGNOSIS — B079 Viral wart, unspecified: Secondary | ICD-10-CM

## 2014-03-18 DIAGNOSIS — B078 Other viral warts: Secondary | ICD-10-CM

## 2014-03-18 NOTE — Patient Instructions (Signed)
Plantar Warts Warts are benign (noncancerous) growths of the outer skin layer. They can occur at any time in life but are most common during childhood and the teen years. Warts can occur on many skin surfaces of the body. When they occur on the underside (sole) of your foot they are called plantar warts. They often emerge in groups with several small warts encircling a larger growth. CAUSES  Human papillomavirus (HPV) is the cause of plantar warts. HPV attacks a break in the skin of the foot. Walking barefoot can lead to exposure to the wart virus. Plantar warts tend to develop over areas of pressure such as the heel and ball of the foot. Plantar warts often grow into the deeper layers of skin. They may spread to other areas of the sole but cannot spread to other areas of the body. SYMPTOMS  You may also notice a growth on the undersurface of your foot. The wart may grow directly into the sole of the foot, or rise above the surface of the skin on the sole of the foot, or both. They are most often flat from pressure. Warts generally do not cause itching but may cause pain in the area of the wart when you put weight on your foot. DIAGNOSIS  Diagnosis is made by physical examination. This means your caregiver discovers it while examining your foot.  TREATMENT  There are many ways to treat plantar warts. However, warts are very tough. Sometimes it is difficult to treat them so that they go away completely and do not grow back. Any treatment must be done regularly to work. If left untreated, most plantar warts will eventually disappear over a period of one to two years. Treatments you can do at home include:  Putting duct tape over the top of the wart (occlusion) has been found to be effective over several months. The duct tape should be removed each night and reapplied until the wart has disappeared.  Placing over-the-counter medications on top of the wart to help kill the wart virus and remove the wart  tissue (salicylic acid, cantharidin, and dichloroacetic acid) are useful. These are called keratolytic agents. These medications make the skin soft and gradually layers will shed away. These compounds are usually placed on the wart each night and then covered with a bandage. They are also available in premedicated bandage form. Avoid surrounding skin when applying these liquids as these medications can burn healthy skin. The treatment may take several months of nightly use to be effective.  Cryotherapy to freeze the wart has recently become available over-the-counter for children 4 years and older. This system makes use of a soft narrow applicator connected to a bottle of compressed cold liquid that is applied directly to the wart. This medication can burn healthy skin and should be used with caution.  As with all over-the-counter medications, read the directions carefully before use. Treatments generally done in your caregiver's office include:  Some aggressive treatments may cause discomfort, discoloration, and scarring of the surrounding skin. The risks and benefits of treatment should be discussed with your caregiver.  Freezing the wart with liquid nitrogen (cryotherapy, see above).  Burning the wart with use of very high heat (cautery).  Injecting medication into the wart.  Surgically removing or laser treatment of the wart.  Your caregiver may refer you to a dermatologist for difficult to treat large-sized warts or large numbers of warts. HOME CARE INSTRUCTIONS   Soak the affected area in warm water. Dry the   area completely when you are done. Remove the top layer of softened skin, then apply the chosen topical medication and reapply a bandage.  Remove the bandage daily and file excess wart tissue (pumice stone works well for this purpose). Repeat the entire process daily or every other day for weeks until the plantar wart disappears.  Several brands of salicylic acid pads are available  as over-the-counter remedies.  Pain can be relieved by wearing a donut bandage. This is a bandage with a hole in it. The bandage is put on with the hole over the wart. This helps take the pressure off the wart and gives pain relief. To help prevent plantar warts:  Wear shoes and socks and change them daily.  Keep feet clean and dry.  Check your feet and your children's feet regularly.  Avoid direct contact with warts on other people.  Have growths or changes on your skin checked by your caregiver. Document Released: 07/08/2003 Document Revised: 09/01/2013 Document Reviewed: 12/16/2008 [employer] Patient Information 2015 [employer]. This information is not intended to replace advice given to you by your health care provider. Make sure you discuss any questions you have with your health care provider.  

## 2014-03-18 NOTE — Progress Notes (Signed)
   Subjective:    Patient ID: [name] ReidSusan R [name], female    DOB: 07/14/1975, 38 y.o.   MRN: [id number]  HPI 38 year old female presents the office today with complaints of a wart on the ball of her left foot. She states this area is in present for approximately 3 weeks and she has pain particularly with pressure and weightbearing to this area. She was previously seen in the emergency department and was diagnosed with a wart and to follow-up with us. She's had no prior treatment. She denies stepping on any foreign object. She has been soaking her feet Epson salts without any resolution. No other complaints at this time.   Review of Systems  All other systems reviewed and are negative.      Objective:   Physical Exam AAO x3, NAD DP/PT pulses palpable bilaterally, CRT less than 3 seconds Protective sensation intact with Simms Weinstein monofilament, vibratory sensation intact, Achilles tendon reflex intact On the plantar aspect the left foot between submetatarsal 3/4 there is a hyperkeratotic lesion. Upon debridement there is a thick hyperkeratotic lesion with some evidence of pinpoint bleeding within the central aspect. There is no surrounding erythema, edema, drainage. There is tenderness directly over the hyperkeratotic lesion. There is no pinpoint bony tenderness or pain with vibratory sensation. MMT 5/5, ROM WNL No open lesions or pre-ulcerative lesions. No calf pain, swelling, warmth, erythema.     Assessment & Plan:  38 year old female with left submetatarsal 3/4 likely verruca. -X-rays were obtained to rule out foreign body. No foreign body was identified. -Treatment options were discussed including alternatives, risks, complications. -Lesion sharply debrided without complications. Upon debridement there was mild evidence of pinpoint bleeding and evidence of verruca. -Discussed treatment options for warts. At [facility] I discussed with the patient  how these the medication as well as side effects. She was also provided written instructions. Continue that she may for approximate 2 weeks. If the area persists directly call the office. Monitoring clinical signs or symptoms of infection and directed to call the office immediately if any are to occur or go to the emergency room. -Follow-up as needed. Again discussed all up in 2 weeks if the lesion persists. In the meantime, call the office with any questions, concerns, change in symptoms.

## 2014-03-19 DIAGNOSIS — B078 Other viral warts: Secondary | ICD-10-CM | POA: Insufficient documentation

## 2014-03-19 DIAGNOSIS — B079 Viral wart, unspecified: Secondary | ICD-10-CM | POA: Insufficient documentation
